# Patient Record
Sex: Female | Born: 1935 | Race: White | Hispanic: No | Smoking: Never smoker
Health system: Southern US, Community
[De-identification: ages and names within clinical notes are randomized; demographics above are authoritative.]

## PROBLEM LIST (undated history)

## (undated) DIAGNOSIS — M25461 Effusion, right knee: Secondary | ICD-10-CM

## (undated) DIAGNOSIS — I639 Cerebral infarction, unspecified: Secondary | ICD-10-CM

## (undated) DIAGNOSIS — R1310 Dysphagia, unspecified: Secondary | ICD-10-CM

## (undated) DIAGNOSIS — E119 Type 2 diabetes mellitus without complications: Secondary | ICD-10-CM

## (undated) DIAGNOSIS — H3530 Unspecified macular degeneration: Secondary | ICD-10-CM

## (undated) DIAGNOSIS — I10 Essential (primary) hypertension: Secondary | ICD-10-CM

## (undated) DIAGNOSIS — M064 Inflammatory polyarthropathy: Secondary | ICD-10-CM

## (undated) DIAGNOSIS — E538 Deficiency of other specified B group vitamins: Secondary | ICD-10-CM

## (undated) DIAGNOSIS — D72829 Elevated white blood cell count, unspecified: Secondary | ICD-10-CM

## (undated) DIAGNOSIS — L89152 Pressure ulcer of sacral region, stage 2: Secondary | ICD-10-CM

## (undated) DIAGNOSIS — I6930 Unspecified sequelae of cerebral infarction: Secondary | ICD-10-CM

## (undated) DIAGNOSIS — N390 Urinary tract infection, site not specified: Secondary | ICD-10-CM

## (undated) DIAGNOSIS — G9340 Encephalopathy, unspecified: Secondary | ICD-10-CM

## (undated) DIAGNOSIS — E785 Hyperlipidemia, unspecified: Secondary | ICD-10-CM

## (undated) DIAGNOSIS — R5381 Other malaise: Secondary | ICD-10-CM

## (undated) DIAGNOSIS — R32 Unspecified urinary incontinence: Secondary | ICD-10-CM

## (undated) DIAGNOSIS — C50919 Malignant neoplasm of unspecified site of unspecified female breast: Secondary | ICD-10-CM

## (undated) DIAGNOSIS — M1711 Unilateral primary osteoarthritis, right knee: Secondary | ICD-10-CM

## (undated) DIAGNOSIS — A419 Sepsis, unspecified organism: Secondary | ICD-10-CM

## (undated) DIAGNOSIS — F0150 Vascular dementia without behavioral disturbance: Secondary | ICD-10-CM

## (undated) HISTORY — PX: BREAST LUMPECTOMY: SHX2

## (undated) HISTORY — DX: Other malaise: R53.81

## (undated) HISTORY — DX: Encephalopathy, unspecified: G93.40

## (undated) HISTORY — DX: Cerebral infarction, unspecified: I63.9

## (undated) HISTORY — DX: Pressure ulcer of sacral region, stage 2: L89.152

## (undated) HISTORY — PX: ABDOMINAL HYSTERECTOMY: SHX81

## (undated) HISTORY — DX: Hyperlipidemia, unspecified: E78.5

## (undated) HISTORY — DX: Effusion, right knee: M25.461

## (undated) HISTORY — DX: Sepsis, unspecified organism: A41.9

## (undated) HISTORY — DX: Malignant neoplasm of unspecified site of unspecified female breast: C50.919

## (undated) HISTORY — DX: Unspecified urinary incontinence: R32

## (undated) HISTORY — DX: Vascular dementia, unspecified severity, without behavioral disturbance, psychotic disturbance, mood disturbance, and anxiety: F01.50

## (undated) HISTORY — DX: Hypomagnesemia: E83.42

## (undated) HISTORY — DX: Essential (primary) hypertension: I10

## (undated) HISTORY — DX: Deficiency of other specified B group vitamins: E53.8

## (undated) HISTORY — DX: Elevated white blood cell count, unspecified: D72.829

## (undated) HISTORY — DX: Unspecified sequelae of cerebral infarction: I69.30

## (undated) HISTORY — DX: Unspecified macular degeneration: H35.30

## (undated) HISTORY — DX: Type 2 diabetes mellitus without complications: E11.9

## (undated) HISTORY — DX: Dysphagia, unspecified: R13.10

## (undated) HISTORY — PX: CATARACT EXTRACTION: SUR2

## (undated) HISTORY — DX: Inflammatory polyarthropathy: M06.4

## (undated) HISTORY — DX: Unilateral primary osteoarthritis, right knee: M17.11

## (undated) HISTORY — DX: Urinary tract infection, site not specified: N39.0

---

## 1998-02-05 DIAGNOSIS — E119 Type 2 diabetes mellitus without complications: Secondary | ICD-10-CM | POA: Insufficient documentation

## 1998-02-05 HISTORY — DX: Type 2 diabetes mellitus without complications: E11.9

## 2009-02-05 DIAGNOSIS — I639 Cerebral infarction, unspecified: Secondary | ICD-10-CM

## 2009-02-05 HISTORY — DX: Cerebral infarction, unspecified: I63.9

## 2016-08-12 LAB — HEPATIC FUNCTION PANEL
ALT: 9 (ref 7–35)
AST: 15 (ref 13–35)
Alkaline Phosphatase: 64 (ref 25–125)
Bilirubin, Total: 0.3

## 2016-08-18 DIAGNOSIS — M25461 Effusion, right knee: Secondary | ICD-10-CM

## 2016-08-18 HISTORY — DX: Effusion, right knee: M25.461

## 2016-08-18 LAB — BASIC METABOLIC PANEL
BUN: 17 (ref 4–21)
CREATININE: 0.7 (ref 0.5–1.1)
POTASSIUM: 3.5 (ref 3.4–5.3)
Sodium: 141 (ref 137–147)

## 2016-08-18 LAB — CBC AND DIFFERENTIAL
HEMATOCRIT: 30 — AB (ref 36–46)
Hemoglobin: 10.2 — AB (ref 12.0–16.0)
Platelets: 429 — AB (ref 150–399)
WBC: 11.5

## 2016-08-20 ENCOUNTER — Encounter: Payer: Self-pay | Admitting: Internal Medicine

## 2016-08-20 ENCOUNTER — Non-Acute Institutional Stay (SKILLED_NURSING_FACILITY): Payer: Medicare Other | Admitting: Internal Medicine

## 2016-08-20 DIAGNOSIS — E785 Hyperlipidemia, unspecified: Secondary | ICD-10-CM | POA: Insufficient documentation

## 2016-08-20 DIAGNOSIS — I6930 Unspecified sequelae of cerebral infarction: Secondary | ICD-10-CM | POA: Insufficient documentation

## 2016-08-20 DIAGNOSIS — E538 Deficiency of other specified B group vitamins: Secondary | ICD-10-CM | POA: Diagnosis not present

## 2016-08-20 DIAGNOSIS — M25461 Effusion, right knee: Secondary | ICD-10-CM | POA: Diagnosis not present

## 2016-08-20 DIAGNOSIS — R197 Diarrhea, unspecified: Secondary | ICD-10-CM

## 2016-08-20 DIAGNOSIS — B373 Candidiasis of vulva and vagina: Secondary | ICD-10-CM | POA: Diagnosis not present

## 2016-08-20 DIAGNOSIS — E119 Type 2 diabetes mellitus without complications: Secondary | ICD-10-CM

## 2016-08-20 DIAGNOSIS — I10 Essential (primary) hypertension: Secondary | ICD-10-CM | POA: Diagnosis not present

## 2016-08-20 DIAGNOSIS — M1990 Unspecified osteoarthritis, unspecified site: Secondary | ICD-10-CM

## 2016-08-20 DIAGNOSIS — G9340 Encephalopathy, unspecified: Secondary | ICD-10-CM

## 2016-08-20 DIAGNOSIS — B3731 Acute candidiasis of vulva and vagina: Secondary | ICD-10-CM | POA: Insufficient documentation

## 2016-08-20 DIAGNOSIS — F0150 Vascular dementia without behavioral disturbance: Secondary | ICD-10-CM | POA: Diagnosis not present

## 2016-08-20 HISTORY — DX: Unspecified sequelae of cerebral infarction: I69.30

## 2016-08-20 HISTORY — DX: Encephalopathy, unspecified: G93.40

## 2016-08-20 HISTORY — DX: Deficiency of other specified B group vitamins: E53.8

## 2016-08-20 LAB — BASIC METABOLIC PANEL
BUN: 21 (ref 4–21)
CREATININE: 0.7 (ref 0.5–1.1)
Glucose: 196
Potassium: 4.4 (ref 3.4–5.3)
Sodium: 138 (ref 137–147)

## 2016-08-20 LAB — CBC AND DIFFERENTIAL
HEMATOCRIT: 34 — AB (ref 36–46)
HEMOGLOBIN: 11.2 — AB (ref 12.0–16.0)
PLATELETS: 495 — AB (ref 150–399)
WBC: 13.4

## 2016-08-20 NOTE — Progress Notes (Signed)
**Note De-[place] via Obfuscation** : Provider:  Noah Delaine. Sheppard Coil, MD Location:  [place] Room Number: [place] of Service:  SNF ([phone])  PCP: Hennie Duos, MD Patient Care Team: Hennie Duos, MD as PCP - General (Internal Medicine)  Extended Emergency Contact Information Primary Emergency Contact: Alumbaugh,[name] Address: [address]          [place], [place] [address] [place] Phone: [phone] Relation: Son     Allergies: Patient has Lauren known allergies.  Chief Complaint  Patient presents with  . New Admit To SNF    following hospitalization [place] 08/12/16 to 08/18/16 altered mental state    HPI: Patient is 83 y.o. female with history of osteoarthritis, hypertension, dementia, history of CVA with left hemiparesis, who went to [facility] emergency department after experiencing 2 weeks of weakness, and decreased activity from her baseline. Sugar use with transfer to toilet and inability to feed herself which was new. She was seen by her PCP for possible UTI was treated for 8 days with Lauren improvement patient had had a leukocytosis at her PCPs office and was noted to have continued right blood cell elevation after treatment. Patient was admitted to [facility] from 7/8-14 to workup her leukocytosis. Patient was found to have a right knee effusion. She underwent arthrocentesis by Dr. Elissa Hefty. Synovial fluid showed 4+ PMNs but Lauren organisms seen on Gram stain and culture was without growth; fluid glucose was 133, there were Lauren crystals present. She was secondary for right knee washout on 08/14/2016 after which leukocytosis and swelling resolved. Patient is now being treated for culture-negative septic arthritis with IV vancomycin and by mouth Cipro until 09/11/2016. Hospital course was complicated by confusion and delirium felt to be secondary to her underlying dementia, sickness, and opiates. This resolved. Hospital course for further, located by  acute diarrhea, now resolved, C. difficile was negative. Patient is admitted to skilled nursing facility for OT/PT. While at skilled nursing facility patient will be followed for hypertension treated with Norvasc and metoprolol, history of CVA with left-sided weakness treated with Plavix, aspirin and statin, and DM 2 treated with glipizide and metformin.  Past Medical History:  Diagnosis Date  . Breast cancer ([place])   . Diabetes mellitus type 2, controlled, without complications ([place]) 7062  . Effusion of right knee 08/18/2016  . Essential hypertension   . Hyperlipidemia   . Hypomagnesemia   . Leucocytosis   . Macular degeneration   . Osteoarthritis of right knee   . Physical debility   . Stroke [facility]) 2011   left sided weakness baseline  . Urinary incontinence in female   . Vascular dementia   . Vitamin B12 deficiency     Past Surgical History:  Procedure Laterality Date  . ABDOMINAL HYSTERECTOMY    . BREAST LUMPECTOMY    . CATARACT EXTRACTION      Allergies as of 08/20/2016   Lauren Known Allergies     Medication List       Accurate as of 08/20/16 10:22 AM. Always use your most recent med list.          acetaminophen 500 MG tablet Commonly known as:  TYLENOL Take 500 mg by mouth. Take one tablet every 6 hours as needed for pain   Acidophilus Lactobacillus Caps Take by mouth. Take one capsule by mouth two times a day with meals   amLODipine 10 MG tablet Commonly known as:  NORVASC Take 10  mg by mouth. Take one tablet daily for blood pressure   aspirin 325 MG tablet Take 325 mg by mouth. Take one tablet nightly   CALCIUM 600-D PO Take by mouth. Take one daily   Cholecalciferol 1000 units tablet Take 1,000 Units by mouth. Take one Vitamin D capsule daily   ciprofloxacin 500 MG tablet Commonly known as:  CIPRO Take 500 mg by mouth. Take one tablet every 12 hours for 24 days   clopidogrel 75 MG tablet Commonly known as:  PLAVIX Take 75 mg by mouth. Take one  tablet daily   glipiZIDE 2.5 MG 24 hr tablet Commonly known as:  GLUCOTROL XL Take 2.5 mg by mouth. Take one tablet daily   heparin NICU PF 100 UNIT/ML Soln injection Inject into the vein. Infuse 3 ml (300 units total) into a venous catheter two times a day. (PICC line flush)   Lidocaine 4 % Ptch Apply topically. Place one patch on the skin daily to right knee   losartan 50 MG tablet Commonly known as:  COZAAR Take 50 mg by mouth. Take one tablet daily   magnesium oxide 400 MG tablet Commonly known as:  MAG-OX Take 400 mg by mouth daily. Take one tablet two times a day for 14 days   metFORMIN 1000 MG tablet Commonly known as:  GLUCOPHAGE Take 1,000 mg by mouth 2 (two) times daily with a meal.   metoprolol tartrate 50 MG tablet Commonly known as:  LOPRESSOR Take 50 mg by mouth. Take one tablet twice daily for blood pressure   multivitamin tablet Take 1 tablet by mouth daily.   nystatin cream Commonly known as:  MYCOSTATIN Apply 1 application topically. Apply one application topically two times a day as needed for dry ski   ondansetron 4 MG tablet Commonly known as:  ZOFRAN Take 4 mg by mouth. Take one tablet every six hours as needed for nausea   oxybutynin 3.9 MG/24HR Commonly known as:  OXYTROL Place 1 patch onto the skin. Place one patch on the skin daily   polyvinyl alcohol 1.4 % ophthalmic solution Commonly known as:  LIQUIFILM TEARS 1 drop. One drop to both eyes four times a day   pravastatin 40 MG tablet Commonly known as:  PRAVACHOL Take 40 mg by mouth. Take one tablet nightly   Vancomycin HCl in NaCl 500-0.9 MG/100ML-% Soln Inject into the vein. Infuse 500 mg into a venous catheter ever twelve hours for 24 days   vitamin B-12 1000 MCG tablet Commonly known as:  CYANOCOBALAMIN Take 1,000 mcg by mouth. Take one Vitamin B-12 tablet daily       Meds ordered this encounter  Medications  . ciprofloxacin (CIPRO) 500 MG tablet    Sig: Take 500 mg by  mouth. Take one tablet every 12 hours for 24 days  . vitamin B-12 (CYANOCOBALAMIN) 1000 MCG tablet    Sig: Take 1,000 mcg by mouth. Take one Vitamin B-12 tablet daily  . Heparin Lock Flush (HEPARIN NICU PF) 100 UNIT/ML SOLN injection    Sig: Inject into the vein. Infuse 3 ml (300 units total) into a venous catheter two times a day. (PICC line flush)  . Acidophilus Lactobacillus CAPS    Sig: Take by mouth. Take one capsule by mouth two times a day with meals  . Lidocaine 4 % PTCH    Sig: Apply topically. Place one patch on the skin daily to right knee  . magnesium oxide (MAG-OX) 400 MG tablet    Sig:  Take 400 mg by mouth daily. Take one tablet two times a day for 14 days  . Vancomycin HCl in NaCl 500-0.9 MG/100ML-% SOLN    Sig: Inject into the vein. Infuse 500 mg into a venous catheter ever twelve hours for 24 days  . losartan (COZAAR) 50 MG tablet    Sig: Take 50 mg by mouth. Take one tablet daily  . acetaminophen (TYLENOL) 500 MG tablet    Sig: Take 500 mg by mouth. Take one tablet every 6 hours as needed for pain  . amLODipine (NORVASC) 10 MG tablet    Sig: Take 10 mg by mouth. Take one tablet daily for blood pressure  . aspirin 325 MG tablet    Sig: Take 325 mg by mouth. Take one tablet nightly  . Calcium Carb-Cholecalciferol (CALCIUM 600-D PO)    Sig: Take by mouth. Take one daily  . Cholecalciferol 1000 units tablet    Sig: Take 1,000 Units by mouth. Take one Vitamin D capsule daily  . clopidogrel (PLAVIX) 75 MG tablet    Sig: Take 75 mg by mouth. Take one tablet daily  . glipiZIDE (GLUCOTROL XL) 2.5 MG 24 hr tablet    Sig: Take 2.5 mg by mouth. Take one tablet daily  . metFORMIN (GLUCOPHAGE) 1000 MG tablet    Sig: Take 1,000 mg by mouth 2 (two) times daily with a meal.  . metoprolol tartrate (LOPRESSOR) 50 MG tablet    Sig: Take 50 mg by mouth. Take one tablet twice daily for blood pressure  . Multiple Vitamin (MULTIVITAMIN) tablet    Sig: Take 1 tablet by mouth daily.  Marland Kitchen  nystatin cream (MYCOSTATIN)    Sig: Apply 1 application topically. Apply one application topically two times a day as needed for dry ski  . ondansetron (ZOFRAN) 4 MG tablet    Sig: Take 4 mg by mouth. Take one tablet every six hours as needed for nausea  . oxybutynin (OXYTROL) 3.9 MG/24HR    Sig: Place 1 patch onto the skin. Place one patch on the skin daily  . polyvinyl alcohol (LIQUIFILM TEARS) 1.4 % ophthalmic solution    Sig: 1 drop. One drop to both eyes four times a day  . pravastatin (PRAVACHOL) 40 MG tablet    Sig: Take 40 mg by mouth. Take one tablet nightly    Immunization History  Administered Date(s) Administered  . Influenza-Unspecified 11/06/2015  . PPD Test 08/18/2016  . Pneumococcal-Unspecified 09/06/2015    Social History  Substance Use Topics  . Smoking status: Never Smoker  . Smokeless tobacco: Never Used  . Alcohol use Lauren    Family history is   Family History  Problem Relation Age of Onset  . Hypertension Mother   . Macular degeneration Father   . Stroke Father   . Macular degeneration Sister       Review of Systems  DATA OBTAINED: from patient, family member GENERAL:  Lauren fevers, +fatigue, appetite changes SKIN: Lauren itching, or rash EYES: Lauren eye pain, redness, discharge EARS: Lauren earache, tinnitus, change in hearing NOSE: Lauren congestion, drainage or bleeding  MOUTH/THROAT: Lauren mouth or tooth pain, Lauren sore throat RESPIRATORY: Lauren cough, wheezing, SOB CARDIAC: Lauren chest pain, palpitations, lower extremity edema  GI: Lauren abdominal pain, Lauren N/V/D or constipation, Lauren heartburn or reflux  GU: Lauren dysuria, frequency or urgency, or incontinence  MUSCULOSKELETAL:Pain He is a little better NEUROLOGIC: Lauren headache, dizziness or focal weakness PSYCHIATRIC: Lauren c/o anxiety or sadness   Vitals:  08/20/16 0916  BP: (!) 153/65  Pulse: 71  Resp: 15  Temp: (!) 97.5 F (36.4 C)    SpO2 Readings from Last 1 Encounters:  08/20/16 96%   Body mass index is 21.22  kg/m.     Physical Exam  GENERAL APPEARANCE: Alert, Moderately conversant,  Lauren acute distress.  SKIN: Right knee with moderate swelling, mild heat, Lauren redness, suture area is healing well. HEAD: Normocephalic, atraumatic  EYES: Conjunctiva/lids clear. Pupils round, reactive. EOMs intact.  EARS: External exam WNL, canals clear. Hearing grossly normal.  NOSE: Lauren deformity or discharge.  MOUTH/THROAT: Lips w/o lesions  RESPIRATORY: Breathing is even, unlabored. Lung sounds are clear   CARDIOVASCULAR: Heart RRR Lauren murmurs, rubs or gallops. Lauren peripheral edema.   GASTROINTESTINAL: Abdomen is soft, non-tender, not distended w/ normal bowel sounds. GENITOURINARY: Bladder non tender, not distended  MUSCULOSKELETAL: Lauren abnormal joints or musculature except CT scan NEUROLOGIC:  Cranial nerves 2-12 grossly intact. Moves all extremities  PSYCHIATRIC: Mood and affect appropriate to situation with some dementia, Lauren behavioral issues  There are Lauren active problems to display for this patient.     Labs reviewed: Basic Metabolic Panel:    Component Value Date/Time   NA 141 08/18/2016   K 3.5 08/18/2016   BUN 17 08/18/2016   CREATININE 0.7 08/18/2016   AST 15 08/12/2016   ALT 9 08/12/2016   ALKPHOS 64 08/12/2016     Recent Labs  08/18/16  NA 141  K 3.5  BUN 17  CREATININE 0.7   Liver Function Tests:  Recent Labs  08/12/16  AST 15  ALT 9  ALKPHOS 64   Lauren results for input(s): LIPASE, AMYLASE in the last 8760 hours. Lauren results for input(s): AMMONIA in the last 8760 hours. CBC:  Recent Labs  08/18/16  WBC 11.5  HGB 10.2*  HCT 30*  PLT 429*   Lipid Lauren results for input(s): CHOL, HDL, LDLCALC, TRIG in the last 8760 hours.  Cardiac Enzymes: Lauren results for input(s): CKTOTAL, CKMB, CKMBINDEX, TROPONINI in the last 8760 hours. BNP: No results for input(s): BNP in the last 8760 hours. Lauren results found for: MICROALBUR Lauren results found for: HGBA1C Lauren results found for:  TSH Lauren results found for: VITAMINB12 Lauren results found for: FOLATE Lauren results found for: IRON, TIBC, FERRITIN  Imaging and Procedures obtained prior to SNF admission: Patient was never admitted.   Not all labs, radiology exams or other studies done during hospitalization come through on my EPIC note; however they are reviewed by me.    Assessment and Plan  LEUKOCYTOSIS/ASEPTIC ARTHRITIS-found to be the source of patient's leukocytosis and resolved after patient underwent arthrocentesis then right knee washout on 08/14/2016. Patient is being treated for culture-negative septic arthritis with IV vancomycin and by mouth Cipro until 09/11/2016 SNF-admitted for OT/PT and for IV antibiotics until 09/11/2016. Vancomycin will be followed with peaks and troughs per pharmacy protocol and patient will have weekly labs including CBC with differential, CMP, sedimentation rate C, C-reactive protein, and vancomycin trough levels which should be faxed to Dr. Vanessa Chickasaw at [phone]  ACUTE ENCEPHALOPATHY DEMENTIA/B 12 DEFICIENCY-felt to be secondary to underlying vascular dementia opiates and hospitalization; patient now baseline; TSH was normal; MRI without acute pathology; patient was found to have a vitamin B-12 deficiency that was repleted with IM B12 and patient is now on oral supplement SNF -continue B12 supplement 1000 g daily  ACUTE DIARRHEA-resolved on its own, C. difficile was negative, Lactobacillus will be continued as  long as patient is on antibiotics SNF -continue 4*250 mg twice a day until a week after 09/11/2016.  History of CVA WITH LEFT WEAKNESS SNF -stable continue ASA 81 mg daily, Plavix 75 mg daily, and statin.  HTN SNF -controlled; continue Norvasc 10 mg daily and Lopressor 75 mg twice a day  DM 2 SNF - we'll stated the blood sugars were controlled in hospital; plan to continue glipizide 2.5 mg 24-hour tablet 1 by mouth daily and Glucophage 1000 mg by mouth twice a day we'll  check blood sugars every morning  [facility] SNF - wound care nurse reports the patient has extensive used and the gluteal fold and GU area; have written for nystatin cream 100,000 units per gram to be applied daily and for Diflucan 200 mg by mouth daily 1 and 100 mg by mouth daily for 10 days.   Time spent greater than 45 minutes;> 50% of time with patient was spent reviewing records, labs, tests and studies, counseling and developing plan of care  Noah Delaine. Sheppard Coil, MD

## 2016-08-29 ENCOUNTER — Other Ambulatory Visit: Payer: Self-pay

## 2016-09-06 DIAGNOSIS — R1310 Dysphagia, unspecified: Secondary | ICD-10-CM

## 2016-09-06 HISTORY — DX: Dysphagia, unspecified: R13.10

## 2016-09-18 ENCOUNTER — Encounter: Payer: Self-pay | Admitting: Internal Medicine

## 2016-09-18 ENCOUNTER — Non-Acute Institutional Stay (SKILLED_NURSING_FACILITY): Payer: Medicare Other | Admitting: Internal Medicine

## 2016-09-18 DIAGNOSIS — R1110 Vomiting, unspecified: Secondary | ICD-10-CM

## 2016-09-18 DIAGNOSIS — K6389 Other specified diseases of intestine: Secondary | ICD-10-CM | POA: Diagnosis not present

## 2016-09-18 DIAGNOSIS — I10 Essential (primary) hypertension: Secondary | ICD-10-CM | POA: Diagnosis not present

## 2016-09-18 DIAGNOSIS — R197 Diarrhea, unspecified: Secondary | ICD-10-CM | POA: Diagnosis not present

## 2016-09-18 DIAGNOSIS — M064 Inflammatory polyarthropathy: Secondary | ICD-10-CM | POA: Diagnosis not present

## 2016-09-18 DIAGNOSIS — E119 Type 2 diabetes mellitus without complications: Secondary | ICD-10-CM | POA: Diagnosis not present

## 2016-09-18 DIAGNOSIS — M00061 Staphylococcal arthritis, right knee: Secondary | ICD-10-CM

## 2016-09-18 DIAGNOSIS — I6930 Unspecified sequelae of cerebral infarction: Secondary | ICD-10-CM | POA: Diagnosis not present

## 2016-09-18 DIAGNOSIS — K222 Esophageal obstruction: Secondary | ICD-10-CM | POA: Diagnosis not present

## 2016-09-18 DIAGNOSIS — R1310 Dysphagia, unspecified: Secondary | ICD-10-CM | POA: Diagnosis not present

## 2016-09-18 DIAGNOSIS — E785 Hyperlipidemia, unspecified: Secondary | ICD-10-CM

## 2016-09-18 DIAGNOSIS — R1319 Other dysphagia: Secondary | ICD-10-CM

## 2016-09-18 DIAGNOSIS — N3000 Acute cystitis without hematuria: Secondary | ICD-10-CM

## 2016-09-18 NOTE — Progress Notes (Signed)
: Provider:   Location:  [facility] Room Number: 107 Place of Service:  SNF (31)  PCP: Hennie Duos, MD Patient Care Team: Hennie Duos, MD as PCP - General (Internal Medicine)  Extended Emergency Contact Information Primary Emergency Contact: Haueter,[name] Address: [address]          [place], [place] [address] [place] Phone: [phone] Relation: Son     Allergies: Patient has no known allergies.  Chief Complaint  Patient presents with  . Readmit To SNF    following hospitalzation 09/06/16 to 09/17/16 dysphagia    HPI: Patient is 83 y.o. female with severe osteoarthritis, hypertension, dementia, stroke with left hemiparesis, and recently diagnosed with right knee septic arthritis who was on for weeks of by mouth Cipro and IV vancomycin to August 7. She presented to [facility] EGD from skilled nursing facility for sore throat for 2 days fever of 100.5 and recurrent vomiting. Patient was admitted to [facility] from 8/2-13 for workup of her recurrent vomiting. She was found to have esophageal stricture with food debris in the esophagus; however patient cannot be dilated because she was on aspirin and Plavix and Lovenox. Patient still needs dilatation as an outpatient. Patient was changed to soft diet until she can have an endoscopy. She was on Reglan and this pushed her into diarrhea and Reglan was DC'd/made when necessary. Hospital course was complicated by a generalized inflammatory part polyarthritis which is treated by IV steroids then by mouth steroids . We will need to be continued in the skilled nursing facility. Patient did finish her IV antibiotic course for her right septic knee and for an acute UTI. Patient is admitted back to skilled nursing facility for continued OT/PT while at skilled nursing facility patient will be followed for hypertension treated with Norvasc and losartan and  metoprolol, hyperlipidemia treated with Pravachol and diabetes type 2 treated with glipizide and metformin.  Past Medical History:  Diagnosis Date  . Breast cancer ([place])   . Diabetes mellitus type 2, controlled, without complications ([place]) 5852  . Dysphagia 09/06/2016  . Effusion of right knee 08/18/2016  . Essential hypertension   . Hyperlipidemia   . Hypomagnesemia   . Leucocytosis   . Macular degeneration   . Osteoarthritis of right knee   . Physical debility   . Stroke [facility]) 2011   left sided weakness baseline  . Urinary incontinence in female   . Vascular dementia   . Vitamin B12 deficiency     Past Surgical History:  Procedure Laterality Date  . ABDOMINAL HYSTERECTOMY    . BREAST LUMPECTOMY    . CATARACT EXTRACTION      Allergies as of 09/18/2016   No Known Allergies     Medication List       Accurate as of 09/18/16  9:55 AM. Always use your most recent med list.          acetaminophen 500 MG tablet Commonly known as:  TYLENOL Take 500 mg by mouth. Take one tablet every 6 hours as needed for pain   Acidophilus Lactobacillus Caps Take by mouth. Take one capsule by mouth two times a day with meals   amLODipine 10 MG tablet Commonly known as:  NORVASC Take 10 mg by mouth. Take one tablet daily for blood pressure   aspirin 325 MG tablet Take 325 mg by mouth. Take one tablet nightly   CALCIUM 600-D  PO Take by mouth. Take one daily   Cholecalciferol 1000 units tablet Take 1,000 Units by mouth. Take one Vitamin D capsule daily   clopidogrel 75 MG tablet Commonly known as:  PLAVIX Take 75 mg by mouth. Take one tablet daily   glipiZIDE 2.5 MG 24 hr tablet Commonly known as:  GLUCOTROL XL Take 2.5 mg by mouth. Take one tablet daily   HUMALOG 100 UNIT/ML injection Generic drug:  insulin lispro Inject into the skin. Moderate SSI, hold after patient off of steroid   HYDROcodone-acetaminophen 5-325 MG tablet Commonly known as:  NORCO/VICODIN Take 1  tablet by mouth. Take one tablet every 6 hours as needed for pain up to 5 days   Lidocaine 4 % Ptch Apply topically. Place one patch on the skin daily to right knee   losartan 50 MG tablet Commonly known as:  COZAAR Take 50 mg by mouth. Take one tablet daily   metFORMIN 1000 MG tablet Commonly known as:  GLUCOPHAGE Take 1,000 mg by mouth 2 (two) times daily with a meal.   metoCLOPramide 5 MG tablet Commonly known as:  REGLAN Take 5 mg by mouth. Take one tablet 3 times daily as needed up to 14 days   metoprolol tartrate 50 MG tablet Commonly known as:  LOPRESSOR Take 50 mg by mouth. Take one tablet twice daily for blood pressure   multivitamin tablet Take 1 tablet by mouth daily.   nystatin cream Commonly known as:  MYCOSTATIN Apply 1 application topically. Apply one application topically two times a day as needed for dry ski   omeprazole 20 MG capsule Commonly known as:  PRILOSEC Take 20 mg by mouth. Take one tablet twice daily   ondansetron 4 MG tablet Commonly known as:  ZOFRAN Take 4 mg by mouth. Take one tablet every six hours as needed for nausea   oxybutynin 3.9 MG/24HR Commonly known as:  OXYTROL Place 1 patch onto the skin. Place one patch on the skin daily   polyvinyl alcohol 1.4 % ophthalmic solution Commonly known as:  LIQUIFILM TEARS 1 drop. One drop to both eyes four times a day   pravastatin 40 MG tablet Commonly known as:  PRAVACHOL Take 40 mg by mouth. Take one tablet nightly   predniSONE 10 MG tablet Commonly known as:  DELTASONE Take 10 mg by mouth. 2 tablets for next 3 days, then one tablet for 3 days, then stop. Use sliding scale insulin while on steroids   vitamin B-12 1000 MCG tablet Commonly known as:  CYANOCOBALAMIN Take 1,000 mcg by mouth. Take one Vitamin B-12 tablet daily       Meds ordered this encounter  Medications  . HYDROcodone-acetaminophen (NORCO/VICODIN) 5-325 MG tablet    Sig: Take 1 tablet by mouth. Take one tablet  every 6 hours as needed for pain up to 5 days  . metoCLOPramide (REGLAN) 5 MG tablet    Sig: Take 5 mg by mouth. Take one tablet 3 times daily as needed up to 14 days  . omeprazole (PRILOSEC) 20 MG capsule    Sig: Take 20 mg by mouth. Take one tablet twice daily  . insulin lispro (HUMALOG) 100 UNIT/ML injection    Sig: Inject into the skin. Moderate SSI, hold after patient off of steroid  . predniSONE (DELTASONE) 10 MG tablet    Sig: Take 10 mg by mouth. 2 tablets for next 3 days, then one tablet for 3 days, then stop. Use sliding scale insulin while on steroids  Immunization History  Administered Date(s) Administered  . Influenza-Unspecified 11/06/2015  . PPD Test 08/18/2016  . Pneumococcal-Unspecified 09/06/2015    Social History  Substance Use Topics  . Smoking status: Never Smoker  . Smokeless tobacco: Never Used  . Alcohol use No    Family history is   Family History  Problem Relation Age of Onset  . Hypertension Mother   . Macular degeneration Father   . Stroke Father   . Macular degeneration Sister       Review of Systems  DATA OBTAINED: From nurse GENERAL:  no fevers, fatigue, appetite changes SKIN: No itching, or rash EYES: No eye pain, redness, discharge EARS: No earache, tinnitus, change in hearing NOSE: No congestion, drainage or bleeding  MOUTH/THROAT: No mouth or tooth pain, No sore throat RESPIRATORY: No cough, wheezing, SOB CARDIAC: No chest pain, palpitations, lower extremity edema  GI: No abdominal pain, No N/V/D or constipation, No heartburn or reflux  GU: No dysuria, frequency or urgency, or incontinence  MUSCULOSKELETAL: No unrelieved bone/joint pain NEUROLOGIC: No headache, dizziness or focal weakness PSYCHIATRIC: No c/o anxiety or sadness   Vitals:   09/18/16 0918  BP: (!) 150/70  Pulse: 78  Resp: 18  Temp: (!) 97 F (36.1 C)  SpO2: 95%    SpO2 Readings from Last 1 Encounters:  09/18/16 95%   Body mass index is 21.91  kg/m.     Physical Exam  GENERAL APPEARANCE: Alert, conversant,  No acute distress.  SKIN: No diaphoresis rash HEAD: Normocephalic, atraumatic  EYES: Conjunctiva/lids clear. Pupils round, reactive. EOMs intact.  EARS: External exam WNL, canals clear. Hearing grossly normal.  NOSE: No deformity or discharge.  MOUTH/THROAT: Lips w/o lesions  RESPIRATORY: Breathing is even, unlabored. Lung sounds are clear   CARDIOVASCULAR: Heart RRR no murmurs, rubs or gallops. No peripheral edema.   GASTROINTESTINAL: Abdomen is soft, non-tender, Mild distended w/ normal bowel sounds. GENITOURINARY: Bladder non tender, not distended  MUSCULOSKELETAL: No abnormal joints or musculature NEUROLOGIC:  Cranial nerves 2-12 grossly intact. Moves all extremities  PSYCHIATRIC: Mood and affect appropriate to situation with dementia, no behavioral issues  Patient Active Problem List   Diagnosis Date Noted  . Arthritis 08/20/2016  . Acute encephalopathy 08/20/2016  . B12 deficiency 08/20/2016  . Acute diarrhea 08/20/2016  . History of stroke with residual deficit 08/20/2016  . Hyperlipidemia 08/20/2016  . Vascular dementia 08/20/2016  . Essential hypertension 08/20/2016  . Yeast infection involving the vagina and surrounding area 08/20/2016  . Effusion of right knee 08/18/2016  . Diabetes mellitus type 2, controlled, without complications ([place]) 37/16/9678      Labs reviewed: Basic Metabolic Panel:    Component Value Date/Time   NA 138 08/20/2016   K 4.4 08/20/2016   BUN 21 08/20/2016   CREATININE 0.7 08/20/2016   AST 15 08/12/2016   ALT 9 08/12/2016   ALKPHOS 64 08/12/2016     Recent Labs  08/18/16 08/20/16  NA 141 138  K 3.5 4.4  BUN 17 21  CREATININE 0.7 0.7   Liver Function Tests:  Recent Labs  08/12/16  AST 15  ALT 9  ALKPHOS 64   No results for input(s): LIPASE, AMYLASE in the last 8760 hours. No results for input(s): AMMONIA in the last 8760 hours. CBC:  Recent Labs   08/18/16 08/20/16  WBC 11.5 13.4  HGB 10.2* 11.2*  HCT 30* 34*  PLT 429* 495*   Lipid No results for input(s): CHOL, HDL, LDLCALC, TRIG in the  last 8760 hours.  Cardiac Enzymes: No results for input(s): CKTOTAL, CKMB, CKMBINDEX, TROPONINI in the last 8760 hours. BNP: No results for input(s): BNP in the last 8760 hours. No results found for: MICROALBUR No results found for: HGBA1C No results found for: TSH No results found for: VITAMINB12 No results found for: FOLATE No results found for: IRON, TIBC, FERRITIN  Imaging and Procedures obtained prior to SNF admission: Patient was never admitted.   Not all labs, radiology exams or other studies done during hospitalization come through on my EPIC note; however they are reviewed by me.    Assessment and Plan  ACUTE DYSPHASIA/ESOPHAGEAL stricture-seen by GI and underwent EGD. Was found to have food debris in the esophagus and have a lower esophageal stricture. Esophagus could not be dilated secondary to the fact the patient was on aspirin, Plavix and Lovenox. Patient will need a follow-up outpatient with Dr. Esperanza Richters for a follow-up EGD and dilatation of stricture after holding Plavix for 7 days. Diet has been changed to soft until EGD can be done. SNF -admitted for OT/PT; will continue soft diet and monitor swallowing.  CHRONIC INTERMITTENT NAUSEA AND VOMITING-felt to be multifactorial relating to esophageal stricture and gastroparesis. X-ray showed persistent gaseous distention of the stomach patient had been tolerating regular diet and was placed on Reglan, however and ordered to avoid problems patient was replaced on soft diet until next endoscopy; patient developed diarrhea w inflammatory polyarthritis hile on scheduled Reglan so Reglan was placed on a when necessary basis SNF - will continue soft diet and Reglan 5 mg 3 times a day when necessary  GENERALIZED INFLAMMATORY POLYARTHRITIS-first few days of admission patient complained of  pain in multiple joints including right knee left knee right elbow. ESR was elevated to 106. No history of rheumatoid arthritis. She was started on IV steroids after which joint pain significantly improved. Patient with evidence placed on a steroid taper which will be continued on discharge to skilled nursing facility SNF - patient to be on prednisone taper for 6 more days and will be on sliding scale insulin until the steroid taper is completed.  RIGHT KNEE SEPTIC ARTHRITIS/UTI-finish course of antibiotics which treated a UTI as well as finishing treatment for patient's septic arthritis.  BACTERIAL OVERGROWTH SYNDROME-patient underwent a core course of Flagyl and rifaximin per ID recommendation with seeming resolution SNF -will monitor patient's for vomiting and diarrhea  DIABETES MELLITUS TYPE II-patient was placed on sliding scale insulin while in hospital while she was on steroids and will continue on sliding scale insulin until she is off her prednisone taper SNF -patient is restarting glipizide 2.5 mg daily and metformin 1000 mg by mouth twice a day with sliding scale insulin 4 times a day with meals and at bedtime for the duration of her steroid taper which is 6 more days  HISTORY OF CVA-with residual left-sided weakness SNF - plan to continue aspirin 325 mg by mouth daily Plavix 75 mg daily; patient is also on statin  HYPERTENSION- SNF - fair control on Norvasc 10 mg daily, losartan 50 mg daily and metoprolol 75 mg twice a day; will monitor every shift  Hyperlipidemia SNF -not stated as uncontrolled; plan to continue Pravachol 40 mg daily   Time spent greater than 35 minutes;> 50% of time with patient was spent reviewing records, labs, tests and studies, counseling and developing plan of care  Webb Silversmith D. Sheppard Coil, MD

## 2016-09-19 ENCOUNTER — Encounter: Payer: Self-pay | Admitting: Internal Medicine

## 2016-09-19 DIAGNOSIS — R1110 Vomiting, unspecified: Secondary | ICD-10-CM | POA: Insufficient documentation

## 2016-09-19 DIAGNOSIS — K6389 Other specified diseases of intestine: Secondary | ICD-10-CM | POA: Insufficient documentation

## 2016-09-19 DIAGNOSIS — K222 Esophageal obstruction: Secondary | ICD-10-CM | POA: Insufficient documentation

## 2016-09-19 DIAGNOSIS — N390 Urinary tract infection, site not specified: Secondary | ICD-10-CM

## 2016-09-19 DIAGNOSIS — T83511A Infection and inflammatory reaction due to indwelling urethral catheter, initial encounter: Secondary | ICD-10-CM

## 2016-09-19 DIAGNOSIS — R197 Diarrhea, unspecified: Secondary | ICD-10-CM

## 2016-09-19 DIAGNOSIS — M064 Inflammatory polyarthropathy: Secondary | ICD-10-CM | POA: Insufficient documentation

## 2016-09-19 DIAGNOSIS — M009 Pyogenic arthritis, unspecified: Secondary | ICD-10-CM | POA: Insufficient documentation

## 2016-09-19 HISTORY — DX: Urinary tract infection, site not specified: N39.0

## 2016-09-19 HISTORY — DX: Inflammatory polyarthropathy: M06.4

## 2016-10-11 LAB — BASIC METABOLIC PANEL
BUN: 18 (ref 4–21)
Creatinine: 0.6 (ref 0.5–1.1)
GLUCOSE: 121
Potassium: 4 (ref 3.4–5.3)
Sodium: 141 (ref 137–147)

## 2016-10-11 LAB — CBC AND DIFFERENTIAL
HEMATOCRIT: 32 — AB (ref 36–46)
HEMOGLOBIN: 10.2 — AB (ref 12.0–16.0)
Platelets: 701 — AB (ref 150–399)
WBC: 10.3

## 2016-10-16 ENCOUNTER — Non-Acute Institutional Stay (SKILLED_NURSING_FACILITY): Payer: Medicare Other | Admitting: Internal Medicine

## 2016-10-16 ENCOUNTER — Encounter: Payer: Self-pay | Admitting: Internal Medicine

## 2016-10-16 DIAGNOSIS — B961 Klebsiella pneumoniae [K. pneumoniae] as the cause of diseases classified elsewhere: Secondary | ICD-10-CM | POA: Diagnosis not present

## 2016-10-16 DIAGNOSIS — N3000 Acute cystitis without hematuria: Secondary | ICD-10-CM

## 2016-10-16 NOTE — Progress Notes (Signed)
Location:  [facility] Room Number: [place]:  SNF ([id number])  Lauren Duos, MD  Patient Care Team: Lauren Duos, MD as PCP - General (Internal Medicine)  Extended Emergency Contact Information Primary Emergency Contact: Furia,[name] Address: [address]          [place], [place] [address] [place] Phone: [phone] Relation: Son    Allergies: Patient has no known allergies.  Chief Complaint  Patient presents with  . Acute Visit    HPI: Patient is 83 y.o. female who Is being seen for a UTI. Several days ago she had some complaint of pain with urination and decreased mental status. No fever no vomiting, some decrease in appetite. Today her urine culture came back with greater than 100,000 of Klebsiella with antibiotic sensitivities.  Past Medical History:  Diagnosis Date  . Acute encephalopathy 08/20/2016  . B12 deficiency 08/20/2016  . Breast cancer ([place])   . Diabetes mellitus type 2, controlled, without complications ([place]) 5732  . Dysphagia 09/06/2016  . Effusion of right knee 08/18/2016  . Essential hypertension   . History of stroke with residual deficit 08/20/2016  . Hyperlipidemia   . Hypomagnesemia   . Leucocytosis   . Macular degeneration   . Osteoarthritis of right knee   . Physical debility   . Stroke [facility]) 2011   left sided weakness baseline  . Urinary incontinence in female   . Vascular dementia   . Vitamin B12 deficiency     Past Surgical History:  Procedure Laterality Date  . ABDOMINAL HYSTERECTOMY    . BREAST LUMPECTOMY    . CATARACT EXTRACTION      Allergies as of 10/16/2016   No Known Allergies     Medication List       Accurate as of 10/16/16 11:59 PM. Always use your most recent med list.          acetaminophen 500 MG tablet Commonly known as:  TYLENOL Take 500 mg by mouth. Take one tablet every 6 hours as needed for pain   Acidophilus Lactobacillus Caps Take  by mouth. Take one capsule by mouth two times a day with meals   amLODipine 10 MG tablet Commonly known as:  NORVASC Take 10 mg by mouth. Take one tablet daily for blood pressure   aspirin 325 MG tablet Take 325 mg by mouth. Take one tablet nightly   CALCIUM 600-D PO Take by mouth. Take one daily   Cholecalciferol 1000 units tablet Take 1,000 Units by mouth. Take one Vitamin D capsule daily   clopidogrel 75 MG tablet Commonly known as:  PLAVIX Take 75 mg by mouth. Take one tablet daily   glipiZIDE 2.5 MG 24 hr tablet Commonly known as:  GLUCOTROL XL Take 2.5 mg by mouth. Take one tablet daily   HUMALOG 100 UNIT/ML injection Generic drug:  insulin lispro Inject into the skin. Moderate SSI, hold after patient off of steroid   HYDROcodone-acetaminophen 5-325 MG tablet Commonly known as:  NORCO/VICODIN Take 1 tablet by mouth. Take one tablet every 6 hours as needed for pain up to 5 days   levofloxacin 500 MG tablet Commonly known as:  LEVAQUIN Take 500 mg by mouth. Take one tablet daily for 7 days for UTI   Lidocaine 4 % Ptch Apply topically. Place one patch on the skin daily to right knee   losartan 50 MG tablet Commonly known as:  COZAAR Take 50 mg  by mouth. Take one tablet daily   metFORMIN 1000 MG tablet Commonly known as:  GLUCOPHAGE Take 1,000 mg by mouth 2 (two) times daily with a meal.   metoCLOPramide 5 MG tablet Commonly known as:  REGLAN Take 5 mg by mouth. Take one tablet 3 times daily as needed up to 14 days   metoprolol tartrate 50 MG tablet Commonly known as:  LOPRESSOR Take 50 mg by mouth. Take one tablet twice daily for blood pressure   multivitamin tablet Take 1 tablet by mouth daily.   omeprazole 20 MG capsule Commonly known as:  PRILOSEC Take 20 mg by mouth. Take one tablet twice daily   ondansetron 4 MG tablet Commonly known as:  ZOFRAN Take 4 mg by mouth. Take one tablet every six hours as needed for nausea   oxybutynin 3.9  MG/24HR Commonly known as:  OXYTROL Place 1 patch onto the skin. Place one patch on the skin daily   polyvinyl alcohol 1.4 % ophthalmic solution Commonly known as:  LIQUIFILM TEARS 1 drop. One drop to both eyes four times a day   pravastatin 40 MG tablet Commonly known as:  PRAVACHOL Take 40 mg by mouth. Take one tablet nightly   predniSONE 10 MG tablet Commonly known as:  DELTASONE Take 10 mg by mouth. 2 tablets for next 3 days, then one tablet for 3 days, then stop. Use sliding scale insulin while on steroids   vitamin B-12 1000 MCG tablet Commonly known as:  CYANOCOBALAMIN Take 1,000 mcg by mouth. Take one Vitamin B-12 tablet daily       Meds ordered this encounter  Medications  . DISCONTD: levofloxacin (LEVAQUIN) 500 MG tablet    Sig: Take 500 mg by mouth. Take one tablet daily for 7 days for UTI    Immunization History  Administered Date(s) Administered  . Influenza-Unspecified 11/06/2015  . PPD Test 08/18/2016  . Pneumococcal-Unspecified 09/06/2015    Social History  Substance Use Topics  . Smoking status: Never Smoker  . Smokeless tobacco: Never Used  . Alcohol use No    Review of Systems  DATA OBTAINED: from Nurse GENERAL:  no fevers, fatigue, appetite changes SKIN: No itching, rash HEENT: No complaint RESPIRATORY: No cough, wheezing, SOB CARDIAC: No chest pain, palpitations, lower extremity edema  GI: No abdominal pain, No N/V/D or constipation, No heartburn or reflux  GU: No dysuria, frequency or urgency, or incontinence  MUSCULOSKELETAL: No unrelieved bone/joint pain NEUROLOGIC: No headache, dizziness  PSYCHIATRIC: No overt anxiety or sadness  Vitals:   10/16/16 1347  BP: (!) 142/72  Pulse: 72  Resp: 18  Temp: (!) 97.3 F (36.3 C)  SpO2: 95%   Body mass index is 20.96 kg/m. Physical Exam  GENERAL APPEARANCE: Alert, conversant, No acute distress  SKIN: No diaphoresis rash HEENT: Unremarkable RESPIRATORY: Breathing is even, unlabored.  Lung sounds are clear   CARDIOVASCULAR: Heart RRR no murmurs, rubs or gallops. No peripheral edema  GASTROINTESTINAL: Abdomen is soft, non-tender, not distended w/ normal bowel sounds.  GENITOURINARY: Bladder non tender, not distended  MUSCULOSKELETAL: No abnormal joints or musculature NEUROLOGIC: Cranial nerves 2-12 grossly intact. Moves all extremities PSYCHIATRIC: Mood and affect With dementia, no behavioral issues  Patient Active Problem List   Diagnosis Date Noted  . Sepsis ([place]) 10/28/2016  . Sacral decubitus ulcer, stage II 10/28/2016  . Acute urinary retention 10/28/2016  . GERD (gastroesophageal reflux disease) 10/28/2016  . Esophageal stricture 09/19/2016  . Vomiting and diarrhea 09/19/2016  . Bacterial overgrowth  syndrome 09/19/2016  . Inflammatory polyarthritis ([place]) 09/19/2016  . Septic arthritis of knee, right ([place]) 09/19/2016  . UTI (urinary tract infection) 09/19/2016  . Dysphagia 09/06/2016  . Arthritis 08/20/2016  . Acute encephalopathy 08/20/2016  . B12 deficiency 08/20/2016  . Acute diarrhea 08/20/2016  . History of stroke with residual deficit 08/20/2016  . Hyperlipidemia 08/20/2016  . Vascular dementia 08/20/2016  . Essential hypertension 08/20/2016  . Yeast infection involving the vagina and surrounding area 08/20/2016  . Effusion of right knee 08/18/2016  . Diabetes mellitus type 2, controlled, without complications ([place]) 15/94/5859    CMP     Component Value Date/Time   NA 141 10/11/2016   K 4.0 10/11/2016   BUN 18 10/11/2016   CREATININE 0.6 10/11/2016   AST 15 08/12/2016   ALT 9 08/12/2016   ALKPHOS 64 08/12/2016    Recent Labs  08/18/16 08/20/16 10/11/16  NA 141 138 141  K 3.5 4.4 4.0  BUN 17 21 18   CREATININE 0.7 0.7 0.6    Recent Labs  08/12/16  AST 15  ALT 9  ALKPHOS 64    Recent Labs  08/18/16 08/20/16 10/11/16  WBC 11.5 13.4 10.3  HGB 10.2* 11.2* 10.2*  HCT 30* 34* 32*  PLT 429* 495* 701*   No results for input(s):  CHOL, LDLCALC, TRIG in the last 8760 hours.  Invalid input(s): HCL No results found for: MICROALBUR No results found for: TSH No results found for: HGBA1C No results found for: CHOL, HDL, LDLCALC, LDLDIRECT, TRIG, CHOLHDL  Significant Diagnostic Results in last 30 days:  No results found.  Assessment and Plan  KLEBSIELLA PNEUMONIAE UTI-creatinine clearance calculated; have written for Levaquin 500 mg daily for 7 days and Florastor 250 mg twice a day for 14 days; we'll monitor her sponsor    Noah Delaine. Sheppard Coil, MD

## 2016-10-24 ENCOUNTER — Non-Acute Institutional Stay (SKILLED_NURSING_FACILITY): Payer: Medicare Other | Admitting: Internal Medicine

## 2016-10-24 ENCOUNTER — Encounter: Payer: Self-pay | Admitting: Internal Medicine

## 2016-10-24 DIAGNOSIS — K219 Gastro-esophageal reflux disease without esophagitis: Secondary | ICD-10-CM

## 2016-10-24 DIAGNOSIS — I10 Essential (primary) hypertension: Secondary | ICD-10-CM

## 2016-10-24 DIAGNOSIS — L89152 Pressure ulcer of sacral region, stage 2: Secondary | ICD-10-CM | POA: Diagnosis not present

## 2016-10-24 DIAGNOSIS — R338 Other retention of urine: Secondary | ICD-10-CM

## 2016-10-24 DIAGNOSIS — E119 Type 2 diabetes mellitus without complications: Secondary | ICD-10-CM | POA: Diagnosis not present

## 2016-10-24 DIAGNOSIS — N3000 Acute cystitis without hematuria: Secondary | ICD-10-CM | POA: Diagnosis not present

## 2016-10-24 DIAGNOSIS — G9340 Encephalopathy, unspecified: Secondary | ICD-10-CM

## 2016-10-24 DIAGNOSIS — A419 Sepsis, unspecified organism: Secondary | ICD-10-CM | POA: Diagnosis not present

## 2016-10-24 NOTE — Progress Notes (Signed)
: Provider:  Noah Delaine. Sheppard Coil, MD Location:  [facility] Room Number: 111 Place of Service:  SNF ([phone])  PCP: Hennie Duos, MD Patient Care Team: Hennie Duos, MD as PCP - General (Internal Medicine)  Extended Emergency Contact Information Primary Emergency Contact: Kahl,[name] Address: [address]          [place], [place] [address] [place] Phone: [phone] Relation: Son     Allergies: Patient has no known allergies.  Chief Complaint  Patient presents with  . Readmit To SNF    following hospitalization 10/18/16 to 10/23/16 acute encephalopathy    HPI: Patient is 83 y.o. female with dementia, diabetes, CVA with left hemiplegia, who has recently had extensive hospitalizations, mostly related to infection. She was admitted last month the hospital of right knee infection for which she was treated with PICC LINE and prolonged IV antibiotics. The patient was with dysphasia and patient was found to have esophageal stricture. Patient has also been struggling with recurrent UTIs. She has baseline dementia and worsening functional status after each hospitalization. Patient was found abnormal urine last week and prescribed with Levaquin and is still 10 still taking Levaquin. She has had further loose stool and was checked for C. difficile and GI pathogens were normal. Today patient was noted to have seizure-like episode with following of fusion which was most likely chills. Because patient was confused after her episode she was brought to the emergency department where she complains of body aches and her buttocks hurting patient was admitted to [facility] from 9/13-18 she was treated for sepsis due to UTI, multidrug resistance, and acute encephalopathy. Patient also has a stage II sacral ulcer. Hospital course was complicated by acute urinary retention, patient retaining up to 800 mL as a post void residual and a Foley  was placed. Patient is admitted back to skilled nursing facility with generalized weakness for OT/PT. While at skilled nursing facility patient will be followed for hypertension treated with Norvasc and Cozaar and metoprolol, diabetes mellitus 2 treated with glipizide and metformin and GERD treated with Prilosec. `  Past Medical History:  Diagnosis Date  . Acute encephalopathy 08/20/2016  . B12 deficiency 08/20/2016  . Breast cancer ([place])   . Diabetes mellitus type 2, controlled, without complications ([place]) 5852  . Dysphagia 09/06/2016  . Effusion of right knee 08/18/2016  . Essential hypertension   . History of stroke with residual deficit 08/20/2016  . Hyperlipidemia   . Hypomagnesemia   . Leucocytosis   . Macular degeneration   . Osteoarthritis of right knee   . Physical debility   . Stroke [facility]) 2011   left sided weakness baseline  . Urinary incontinence in female   . Vascular dementia   . Vitamin B12 deficiency     Past Surgical History:  Procedure Laterality Date  . ABDOMINAL HYSTERECTOMY    . BREAST LUMPECTOMY    . CATARACT EXTRACTION      Allergies as of 10/24/2016   No Known Allergies     Medication List       Accurate as of 10/24/16  9:14 AM. Always use your most recent med list.          acetaminophen 500 MG tablet Commonly known as:  TYLENOL Take 500 mg by mouth. Take one tablet every 6 hours as needed for pain   amLODipine 10 MG tablet Commonly known as:  NORVASC Take 10 mg by  mouth. Take one tablet daily for blood pressure   aspirin 325 MG tablet Take 325 mg by mouth. Take one tablet nightly   BIOFREEZE 4 % Gel Generic drug:  Menthol (Topical Analgesic) Apply topically. Apply one application topically to both knees 3 times daily as needed   CALCIUM 600-D PO Take by mouth. Take one daily   Cholecalciferol 1000 units tablet Take 1,000 Units by mouth. Take one Vitamin D capsule daily   clopidogrel 75 MG tablet Commonly known as:  PLAVIX Take  75 mg by mouth. Take one tablet daily   glipiZIDE 2.5 MG 24 hr tablet Commonly known as:  GLUCOTROL XL Take 2.5 mg by mouth. Take one tablet daily   Lidocaine 4 % Ptch Apply topically. Place one patch on the skin daily to right knee   losartan 50 MG tablet Commonly known as:  COZAAR Take 50 mg by mouth. Take one tablet daily   metFORMIN 1000 MG tablet Commonly known as:  GLUCOPHAGE Take 1,000 mg by mouth 2 (two) times daily with a meal.   metoprolol tartrate 50 MG tablet Commonly known as:  LOPRESSOR Take 50 mg by mouth. Take one tablet twice daily for blood pressure   multivitamin tablet Take 1 tablet by mouth daily.   nitrofurantoin 100 MG capsule Commonly known as:  MACRODANTIN Take 100 mg by mouth. Take one capsule 2 times daily for 10 days.   omeprazole 20 MG capsule Commonly known as:  PRILOSEC Take 20 mg by mouth. Take one tablet twice daily   ondansetron 4 MG tablet Commonly known as:  ZOFRAN Take 4 mg by mouth. Take one tablet every six hours as needed for nausea   oxybutynin 5 MG tablet Commonly known as:  DITROPAN Take 5 mg by mouth. Take one tablet 2 times daily   polyvinyl alcohol 1.4 % ophthalmic solution Commonly known as:  LIQUIFILM TEARS 1 drop. One drop to both eyes four times a day   pravastatin 40 MG tablet Commonly known as:  PRAVACHOL Take 40 mg by mouth. Take one tablet nightly   saccharomyces boulardii 250 MG capsule Commonly known as:  FLORASTOR Take 250 mg by mouth 2 (two) times daily.   vitamin B-12 1000 MCG tablet Commonly known as:  CYANOCOBALAMIN Take 1,000 mcg by mouth. Take one Vitamin B-12 tablet daily       Meds ordered this encounter  Medications  . nitrofurantoin (MACRODANTIN) 100 MG capsule    Sig: Take 100 mg by mouth. Take one capsule 2 times daily for 10 days.  . Menthol, Topical Analgesic, (BIOFREEZE) 4 % GEL    Sig: Apply topically. Apply one application topically to both knees 3 times daily as needed  .  oxybutynin (DITROPAN) 5 MG tablet    Sig: Take 5 mg by mouth. Take one tablet 2 times daily  . saccharomyces boulardii (FLORASTOR) 250 MG capsule    Sig: Take 250 mg by mouth 2 (two) times daily.    Immunization History  Administered Date(s) Administered  . Influenza-Unspecified 11/06/2015  . PPD Test 08/18/2016  . Pneumococcal-Unspecified 09/06/2015    Social History  Substance Use Topics  . Smoking status: Never Smoker  . Smokeless tobacco: Never Used  . Alcohol use No    Family history is   Family History  Problem Relation Age of Onset  . Hypertension Mother   . Macular degeneration Father   . Stroke Father   . Macular degeneration Sister       Review  of Systems  DATA OBTAINED: From nurse, family member  GENERAL:  no fevers, fatigue, appetite changes, Failing would like patient to have Glucerna  SKIN: No itching, or rash EYES: No eye pain, redness, discharge EARS: No earache, tinnitus, change in hearing NOSE: No congestion, drainage or bleeding  MOUTH/THROAT: No mouth or tooth pain, No sore throat RESPIRATORY: No cough, wheezing, SOB CARDIAC: No chest pain, palpitations, lower extremity edema  GI: No abdominal pain, No N/V/D or constipation, No heartburn or reflux  GU: No dysuria, frequency or urgency, or incontinence  MUSCULOSKELETAL: No unrelieved bone/joint pain NEUROLOGIC: No headache, dizziness or focal weakness PSYCHIATRIC: No c/o anxiety or sadness   Vitals:   10/24/16 0850  BP: 109/72  Pulse: 70  Resp: 18  Temp: 98.4 F (36.9 C)    SpO2 Readings from Last 1 Encounters:  10/16/16 95%   Body mass index is 20.67 kg/m.     Physical Exam  GENERAL APPEARANCE: Alert,  No acute distress.  SKIN: No diaphoresis rash HEAD: Normocephalic, atraumatic  EYES: Conjunctiva/lids clear. Pupils round, reactive. EOMs intact.  EARS: External exam WNL, canals clear. Hearing grossly normal.  NOSE: No deformity or discharge.  MOUTH/THROAT: Lips w/o lesions    RESPIRATORY: Breathing is even, unlabored. Lung sounds are clear   CARDIOVASCULAR: Heart RRR no murmurs, rubs or gallops. No peripheral edema.   GASTROINTESTINAL: Abdomen is soft, non-tender, not distended w/ normal bowel sounds. GENITOURINARY: Bladder non tender, not distended  MUSCULOSKELETAL: No abnormal joints or musculature NEUROLOGIC:  Cranial nerves 2-12 grossly intact; left hemiplegia PSYCHIATRIC: Mood and affect with dementia, no behavioral issues  Patient Active Problem List   Diagnosis Date Noted  . Esophageal stricture 09/19/2016  . Vomiting and diarrhea 09/19/2016  . Bacterial overgrowth syndrome 09/19/2016  . Inflammatory polyarthritis ([place]) 09/19/2016  . Septic arthritis of knee, right ([place]) 09/19/2016  . UTI (urinary tract infection) 09/19/2016  . Dysphagia 09/06/2016  . Arthritis 08/20/2016  . Acute encephalopathy 08/20/2016  . B12 deficiency 08/20/2016  . Acute diarrhea 08/20/2016  . History of stroke with residual deficit 08/20/2016  . Hyperlipidemia 08/20/2016  . Vascular dementia 08/20/2016  . Essential hypertension 08/20/2016  . Yeast infection involving the vagina and surrounding area 08/20/2016  . Effusion of right knee 08/18/2016  . Diabetes mellitus type 2, controlled, without complications ([place]) 85/27/7824      Labs reviewed: Basic Metabolic Panel:    Component Value Date/Time   NA 141 10/11/2016   K 4.0 10/11/2016   BUN 18 10/11/2016   CREATININE 0.6 10/11/2016   AST 15 08/12/2016   ALT 9 08/12/2016   ALKPHOS 64 08/12/2016     Recent Labs  08/18/16 08/20/16 10/11/16  NA 141 138 141  K 3.5 4.4 4.0  BUN 17 21 18   CREATININE 0.7 0.7 0.6   Liver Function Tests:  Recent Labs  08/12/16  AST 15  ALT 9  ALKPHOS 64   No results for input(s): LIPASE, AMYLASE in the last 8760 hours. No results for input(s): AMMONIA in the last 8760 hours. CBC:  Recent Labs  08/18/16 08/20/16 10/11/16  WBC 11.5 13.4 10.3  HGB 10.2* 11.2* 10.2*   HCT 30* 34* 32*  PLT 429* 495* 701*   Lipid No results for input(s): CHOL, HDL, LDLCALC, TRIG in the last 8760 hours.  Cardiac Enzymes: No results for input(s): CKTOTAL, CKMB, CKMBINDEX, TROPONINI in the last 8760 hours. BNP: No results for input(s): BNP in the last 8760 hours. No results found for: [facility]  No results found for: HGBA1C No results found for: TSH No results found for: VITAMINB12 No results found for: FOLATE No results found for: IRON, TIBC, FERRITIN  Imaging and Procedures obtained prior to SNF admission: Patient was never admitted.   Not all labs, radiology exams or other studies done during hospitalization come through on my EPIC note; however they are reviewed by me.    Assessment and Plan  SEPSIS/UTI/ACUTE ENCEPHALOPATHY-patient treated with Invanz, blood cultures were negative; urine culture did not grow any organism patient was on Levaquin 4 days prior to coming to the hospital; renal ultrasound does not show any obvious obstruction; patient will be discharged with nitrofurantoin SNF - admitted with generalized weakness for OT/PT plan to continue Macrobid 100 mg twice a day for 10 days  DM2-patient was maintained on sliding scale insulin while in the hospital with plans to put her back on her oral meds on discharge SNF - plan to restart glipizide 2.5 mg daily and metformin 1000 mg twice a day; will obtain a blood sugars  SACRAL DECUBITUS STAGE II SNF - wound care to follow  ACUTE URINARY RETENTION SNF -per family member patient had postvoid residuals anywhere from 600 800 mL; therefore Foley was placed; secondary to this issue will continue Foley until patient can be followed up by urology as an outpatient  HYPERTENSION SNF -well controlled today; plan to continue Norvasc 10 mg by mouth daily, metoprolol 75 mg twice a day and Cozaar 50 mg by mouth daily  GERD SNF - not stated as uncontrolled; plan to continue omeprazole 20 mg twice a day   Time  spent greater than 45 minutes ;> 50% of time with patient was spent reviewing records, labs, tests and studies, counseling and developing plan of care  Webb Silversmith D. Sheppard Coil, MD

## 2016-10-28 ENCOUNTER — Encounter: Payer: Self-pay | Admitting: Internal Medicine

## 2016-10-28 DIAGNOSIS — L89152 Pressure ulcer of sacral region, stage 2: Secondary | ICD-10-CM | POA: Insufficient documentation

## 2016-10-28 DIAGNOSIS — K219 Gastro-esophageal reflux disease without esophagitis: Secondary | ICD-10-CM | POA: Insufficient documentation

## 2016-10-28 DIAGNOSIS — A419 Sepsis, unspecified organism: Secondary | ICD-10-CM

## 2016-10-28 DIAGNOSIS — R338 Other retention of urine: Secondary | ICD-10-CM | POA: Insufficient documentation

## 2016-10-28 HISTORY — DX: Pressure ulcer of sacral region, stage 2: L89.152

## 2016-10-28 HISTORY — DX: Sepsis, unspecified organism: A41.9

## 2016-10-29 LAB — CBC AND DIFFERENTIAL
HEMATOCRIT: 28 — AB (ref 36–46)
Hemoglobin: 8.9 — AB (ref 12.0–16.0)
PLATELETS: 522 — AB (ref 150–399)
WBC: 13.3

## 2016-11-17 ENCOUNTER — Encounter: Payer: Self-pay | Admitting: Internal Medicine

## 2016-11-21 ENCOUNTER — Non-Acute Institutional Stay (SKILLED_NURSING_FACILITY): Payer: Medicare Other | Admitting: Internal Medicine

## 2016-11-21 ENCOUNTER — Encounter: Payer: Self-pay | Admitting: Internal Medicine

## 2016-11-21 DIAGNOSIS — F0150 Vascular dementia without behavioral disturbance: Secondary | ICD-10-CM | POA: Diagnosis not present

## 2016-11-21 DIAGNOSIS — I10 Essential (primary) hypertension: Secondary | ICD-10-CM

## 2016-11-21 DIAGNOSIS — I6930 Unspecified sequelae of cerebral infarction: Secondary | ICD-10-CM | POA: Diagnosis not present

## 2016-11-21 NOTE — Progress Notes (Signed)
Location:  [place] Room Number: [place] of Service:  SNF (31)  Hennie Duos, MD  Patient Care Team: Hennie Duos, MD as PCP - General (Internal Medicine)  Extended Emergency Contact Information Primary Emergency Contact: Ibsen,[name] Address: [address]          [place],  [address] [place] Phone: [phone] Relation: Son    Allergies: Patient has no known allergies.  Chief Complaint  Patient presents with  . Medical Management of Chronic Issues    routine visit  . Health maintenance    orders written for next lab do HgbA1c, schedule pt with foot doctor and eye doctor -in house.     HPI: Patient is 83 y.o. female who Who is being seen for routine issues of dementia, hypertension, and history of stroke with residual deficit.  Past Medical History:  Diagnosis Date  . Acute encephalopathy 08/20/2016  . B12 deficiency 08/20/2016  . Breast cancer ([place])   . Diabetes mellitus type 2, controlled, without complications ([place]) 9622  . Dysphagia 09/06/2016  . Effusion of right knee 08/18/2016  . Essential hypertension   . History of stroke with residual deficit 08/20/2016  . Hyperlipidemia   . Hypomagnesemia   . Leucocytosis   . Macular degeneration   . Osteoarthritis of right knee   . Physical debility   . Stroke [facility]) 2011   left sided weakness baseline  . Urinary incontinence in female   . Vascular dementia   . Vitamin B12 deficiency     Past Surgical History:  Procedure Laterality Date  . ABDOMINAL HYSTERECTOMY    . BREAST LUMPECTOMY    . CATARACT EXTRACTION      Allergies as of 11/21/2016   No Known Allergies     Medication List       Accurate as of 11/21/16 11:59 PM. Always use your most recent med list.          acetaminophen 500 MG tablet Commonly known as:  TYLENOL Take 500 mg by mouth. Take one tablet every 6 hours as needed for pain   amLODipine 10 MG  tablet Commonly known as:  NORVASC Take 10 mg by mouth. Take one tablet daily for blood pressure   aspirin 325 MG tablet Take 325 mg by mouth. Take one tablet nightly   BIOFREEZE 4 % Gel Generic drug:  Menthol (Topical Analgesic) Apply topically. Apply one application topically to both knees 3 times daily as needed   CALCIUM 600-D PO Take by mouth. Take one daily   Cholecalciferol 1000 units tablet Take 1,000 Units by mouth. Take one Vitamin D capsule daily   clopidogrel 75 MG tablet Commonly known as:  PLAVIX Take 75 mg by mouth. Take one tablet daily   glipiZIDE 2.5 MG 24 hr tablet Commonly known as:  GLUCOTROL XL Take 2.5 mg by mouth. Take one tablet daily   GLUCERNA Liqd Take by mouth. Drink three times daily between meals due to weight loss and wound healing   Lidocaine 4 % Ptch Apply topically. Place one patch on the skin daily to right knee   losartan 50 MG tablet Commonly known as:  COZAAR Take 50 mg by mouth. Take one tablet daily   metFORMIN 1000 MG tablet Commonly known as:  GLUCOPHAGE Take 1,000 mg by mouth 2 (two) times daily with a meal.   metoprolol tartrate 50 MG tablet Commonly known as:  LOPRESSOR Take 50 mg by  mouth. Take one and a half tablet (75 mg)  twice daily for blood pressure   multivitamin tablet Take 1 tablet by mouth daily.   omeprazole 20 MG capsule Commonly known as:  PRILOSEC Take 20 mg by mouth. Take one tablet twice daily   ondansetron 4 MG tablet Commonly known as:  ZOFRAN Take 4 mg by mouth. Take one tablet every six hours as needed for nausea   oxybutynin 5 MG tablet Commonly known as:  DITROPAN Take 5 mg by mouth. Take one tablet 2 times daily   polyvinyl alcohol 1.4 % ophthalmic solution Commonly known as:  LIQUIFILM TEARS 1 drop. One drop to both eyes four times a day   pravastatin 40 MG tablet Commonly known as:  PRAVACHOL Take 40 mg by mouth. Take one tablet nightly   saccharomyces boulardii 250 MG  capsule Commonly known as:  FLORASTOR Take 250 mg by mouth 2 (two) times daily.   vitamin B-12 1000 MCG tablet Commonly known as:  CYANOCOBALAMIN Take 1,000 mcg by mouth. Take one Vitamin B-12 tablet daily       Meds ordered this encounter  Medications  . GLUCERNA ([place]) LIQD    Sig: Take by mouth. Drink three times daily between meals due to weight loss and wound healing    Immunization History  Administered Date(s) Administered  . Influenza-Unspecified 11/06/2015  . PPD Test 08/18/2016  . Pneumococcal-Unspecified 09/06/2015    Social History  Substance Use Topics  . Smoking status: Never Smoker  . Smokeless tobacco: Never Used  . Alcohol use No    Review of Systems  DATA OBTAINED: from patient, nurse GENERAL:  no fevers, fatigue, appetite changes SKIN: No itching, rash HEENT: No complaint RESPIRATORY: No cough, wheezing, SOB CARDIAC: No chest pain, palpitations, lower extremity edema  GI: No abdominal pain, No N/V/D or constipation, No heartburn or reflux  GU: No dysuria, frequency or urgency, or incontinence  MUSCULOSKELETAL: No unrelieved bone/joint pain NEUROLOGIC: No headache, dizziness  PSYCHIATRIC: No overt anxiety or sadness  Vitals:   11/21/16 1200  BP: 116/69  Pulse: 84  Resp: 18  Temp: (!) 97.2 F (36.2 C)   Body mass index is 20.59 kg/m. Physical Exam  GENERAL APPEARANCE: Alert,  No acute distress  SKIN: No diaphoresis rash HEENT: Unremarkable RESPIRATORY: Breathing is even, unlabored. Lung sounds are clear   CARDIOVASCULAR: Heart RRR no murmurs, rubs or gallops. No peripheral edema  GASTROINTESTINAL: Abdomen is soft, non-tender, not distended w/ normal bowel sounds.  GENITOURINARY: Bladder non tender, not distended  MUSCULOSKELETAL: No abnormal joints or musculature NEUROLOGIC: Cranial nerves 2-12 grossly intact; Left-sided weakness PSYCHIATRIC: Mood and affect appropriate to situation with dementia, no behavioral issues  Patient  Active Problem List   Diagnosis Date Noted  . Sepsis ([place]) 10/28/2016  . Sacral decubitus ulcer, stage II 10/28/2016  . Acute urinary retention 10/28/2016  . GERD (gastroesophageal reflux disease) 10/28/2016  . Esophageal stricture 09/19/2016  . Vomiting and diarrhea 09/19/2016  . Bacterial overgrowth syndrome 09/19/2016  . Inflammatory polyarthritis ([place]) 09/19/2016  . Septic arthritis of knee, right ([place]) 09/19/2016  . UTI (urinary tract infection) 09/19/2016  . Dysphagia 09/06/2016  . Arthritis 08/20/2016  . Acute encephalopathy 08/20/2016  . B12 deficiency 08/20/2016  . Acute diarrhea 08/20/2016  . History of stroke with residual deficit 08/20/2016  . Hyperlipidemia 08/20/2016  . Vascular dementia 08/20/2016  . Essential hypertension 08/20/2016  . Yeast infection involving the vagina and surrounding area 08/20/2016  . Effusion of right  knee 08/18/2016  . Diabetes mellitus type 2, controlled, without complications ([place]) 83/33/8329    CMP     Component Value Date/Time   NA 141 10/11/2016   K 4.0 10/11/2016   BUN 18 10/11/2016   CREATININE 0.6 10/11/2016   AST 15 08/12/2016   ALT 9 08/12/2016   ALKPHOS 64 08/12/2016    Recent Labs  08/18/16 08/20/16 10/11/16  NA 141 138 141  K 3.5 4.4 4.0  BUN 17 21 18   CREATININE 0.7 0.7 0.6    Recent Labs  08/12/16  AST 15  ALT 9  ALKPHOS 64    Recent Labs  08/20/16 10/11/16 10/29/16  WBC 13.4 10.3 13.3  HGB 11.2* 10.2* 8.9*  HCT 34* 32* 28*  PLT 495* 701* 522*   No results for input(s): CHOL, LDLCALC, TRIG in the last 8760 hours.  Invalid input(s): HCL No results found for: MICROALBUR No results found for: TSH No results found for: HGBA1C No results found for: CHOL, HDL, LDLCALC, LDLDIRECT, TRIG, CHOLHDL  Significant Diagnostic Results in last 30 days:  No results found.  Assessment and Plan  Vascular dementia Chronic and stable; on no meds; plan to continue supportive care  Essential  hypertension Controlled; continue Norvasc 10 mg by mouth daily Lopressor 75 mg by mouth twice a day and Cozaar 50 mg by mouth daily  History of stroke with residual deficit stable; plan to continue ASA 325 mg by mouth daily, Plavix 75 mg by mouth daily as prophylaxis and statin Pravachol 40 mg daily with management     Webb Silversmith D. Sheppard Coil, MD

## 2016-11-22 ENCOUNTER — Encounter: Payer: Self-pay | Admitting: Internal Medicine

## 2016-11-22 NOTE — Assessment & Plan Note (Signed)
Chronic and stable; on no meds; plan to continue supportive care

## 2016-11-22 NOTE — Assessment & Plan Note (Signed)
Controlled; continue Norvasc 10 mg by mouth daily Lopressor 75 mg by mouth twice a day and Cozaar 50 mg by mouth daily

## 2016-11-22 NOTE — Assessment & Plan Note (Signed)
stable; plan to continue ASA 325 mg by mouth daily, Plavix 75 mg by mouth daily as prophylaxis and statin Pravachol 40 mg daily with management

## 2016-11-26 LAB — BASIC METABOLIC PANEL
BUN: 22 — AB (ref 4–21)
Creatinine: 0.6 (ref 0.5–1.1)
GLUCOSE: 151
POTASSIUM: 4.5 (ref 3.4–5.3)
Sodium: 137 (ref 137–147)

## 2016-11-26 LAB — LIPID PANEL
CHOLESTEROL: 164 (ref 0–200)
HDL: 57 (ref 35–70)
LDL CALC: 63
Triglycerides: 222 — AB (ref 40–160)

## 2016-11-26 LAB — VITAMIN B12: Vitamin B-12: 1055

## 2016-11-26 LAB — TSH: TSH: 2.58 (ref 0.41–5.90)

## 2016-11-26 LAB — CBC AND DIFFERENTIAL
HCT: 34 — AB (ref 36–46)
HEMOGLOBIN: 10.9 — AB (ref 12.0–16.0)
PLATELETS: 419 — AB (ref 150–399)
WBC: 17.2

## 2016-11-26 LAB — HEPATIC FUNCTION PANEL
ALK PHOS: 76 (ref 25–125)
ALT: 10 (ref 7–35)
AST: 15 (ref 13–35)
Bilirubin, Total: 0.2

## 2016-11-26 LAB — VITAMIN D 25 HYDROXY (VIT D DEFICIENCY, FRACTURES): VIT D 25 HYDROXY: 36.71

## 2016-11-26 LAB — HEMOGLOBIN A1C: HEMOGLOBIN A1C: 5.7

## 2016-11-27 ENCOUNTER — Encounter: Payer: Self-pay | Admitting: Internal Medicine

## 2016-11-27 ENCOUNTER — Non-Acute Institutional Stay (SKILLED_NURSING_FACILITY): Payer: Medicare Other | Admitting: Internal Medicine

## 2016-11-27 DIAGNOSIS — D72829 Elevated white blood cell count, unspecified: Secondary | ICD-10-CM

## 2016-11-27 DIAGNOSIS — Z960 Presence of urogenital implants: Secondary | ICD-10-CM | POA: Diagnosis not present

## 2016-11-27 DIAGNOSIS — Z978 Presence of other specified devices: Secondary | ICD-10-CM

## 2016-11-27 DIAGNOSIS — R339 Retention of urine, unspecified: Secondary | ICD-10-CM | POA: Diagnosis not present

## 2016-11-27 NOTE — Progress Notes (Signed)
Location:  [place] Room Number: [place] of Service:  SNF (31)  Hennie Duos, MD  Patient Care Team: Hennie Duos, MD as PCP - General (Internal Medicine)  Extended Emergency Contact Information Primary Emergency Contact: Dillinger,[name] Address: [address]          [place], [place] [address] [place] Phone: [phone] Relation: Son    Allergies: Patient has no known allergies.  Chief Complaint  Patient presents with  . Acute Visit    abnormal labs    HPI: Patient is 83 y.o. female who is being seen today because her white cell count on routine lab came back at 17.2. Patient does wear chronic Foley, nurses have not reported any unusual color or odor to urine. No reported fevers patient denies any pain or discomfort, she looks fine. Patient does have a chronic indwelling catheter for urinary retention.  Past Medical History:  Diagnosis Date  . Acute encephalopathy 08/20/2016  . B12 deficiency 08/20/2016  . Breast cancer ([place])   . Diabetes mellitus type 2, controlled, without complications ([place]) 4818  . Dysphagia 09/06/2016  . Effusion of right knee 08/18/2016  . Essential hypertension   . History of stroke with residual deficit 08/20/2016  . Hyperlipidemia   . Hypomagnesemia   . Leucocytosis   . Macular degeneration   . Osteoarthritis of right knee   . Physical debility   . Stroke [facility]) 2011   left sided weakness baseline  . Urinary incontinence in female   . Vascular dementia   . Vitamin B12 deficiency     Past Surgical History:  Procedure Laterality Date  . ABDOMINAL HYSTERECTOMY    . BREAST LUMPECTOMY    . CATARACT EXTRACTION      Allergies as of 11/27/2016   No Known Allergies     Medication List       Accurate as of 11/27/16  2:55 PM. Always use your most recent med list.          acetaminophen 500 MG tablet Commonly known as:  TYLENOL Take 500 mg by mouth. Take one tablet  every 6 hours as needed for pain   amLODipine 10 MG tablet Commonly known as:  NORVASC Take 10 mg by mouth. Take one tablet daily for blood pressure   aspirin 325 MG tablet Take 325 mg by mouth. Take one tablet nightly   BIOFREEZE 4 % Gel Generic drug:  Menthol (Topical Analgesic) Apply topically. Apply one application topically to both knees 3 times daily as needed   CALCIUM 600-D PO Take by mouth. Take one daily   Cholecalciferol 1000 units tablet Take 1,000 Units by mouth. Take one Vitamin D capsule daily   clopidogrel 75 MG tablet Commonly known as:  PLAVIX Take 75 mg by mouth. Take one tablet daily   glipiZIDE 2.5 MG 24 hr tablet Commonly known as:  GLUCOTROL XL Take 2.5 mg by mouth. Take one tablet daily   GLUCERNA Liqd Take by mouth. Drink three times daily between meals due to weight loss and wound healing   Lidocaine 4 % Ptch Apply topically. Place one patch on the skin daily to right knee   losartan 50 MG tablet Commonly known as:  COZAAR Take 50 mg by mouth. Take one tablet daily   metFORMIN 1000 MG tablet Commonly known as:  GLUCOPHAGE Take 1,000 mg by mouth 2 (two) times daily with a meal.   metoprolol tartrate 50 MG  tablet Commonly known as:  LOPRESSOR Take 50 mg by mouth. Take one and a half tablet (75 mg)  twice daily for blood pressure   multivitamin tablet Take 1 tablet by mouth daily.   omeprazole 20 MG capsule Commonly known as:  PRILOSEC Take 20 mg by mouth. Take one tablet twice daily   ondansetron 4 MG tablet Commonly known as:  ZOFRAN Take 4 mg by mouth. Take one tablet every six hours as needed for nausea   oxybutynin 5 MG tablet Commonly known as:  DITROPAN Take 5 mg by mouth. Take one tablet 2 times daily   polyvinyl alcohol 1.4 % ophthalmic solution Commonly known as:  LIQUIFILM TEARS 1 drop. One drop to both eyes four times a day   pravastatin 40 MG tablet Commonly known as:  PRAVACHOL Take 40 mg by mouth. Take one tablet  nightly   saccharomyces boulardii 250 MG capsule Commonly known as:  FLORASTOR Take 250 mg by mouth 2 (two) times daily.   vitamin B-12 1000 MCG tablet Commonly known as:  CYANOCOBALAMIN Take 1,000 mcg by mouth. Take one Vitamin B-12 tablet daily       No orders of the defined types were placed in this encounter.   Immunization History  Administered Date(s) Administered  . Influenza-Unspecified 11/06/2015, 11/26/2016  . PPD Test 08/18/2016  . Pneumococcal-Unspecified 09/06/2015    Social History  Substance Use Topics  . Smoking status: Never Smoker  . Smokeless tobacco: Never Used  . Alcohol use No    Review of Systems  DATA OBTAINED: from patient, nurse GENERAL:  no fevers, fatigue, appetite changes SKIN: No itching, rash HEENT: No complaint RESPIRATORY: No cough, wheezing, SOB CARDIAC: No chest pain, palpitations, lower extremity edema  GI: No abdominal pain, No N/V/D or constipation, No heartburn or reflux  GU: No dysuria, frequency or urgency, or incontinence  MUSCULOSKELETAL: No unrelieved bone/joint pain NEUROLOGIC: No headache, dizziness  PSYCHIATRIC: No overt anxiety or sadness  Vitals:   11/27/16 1446  BP: 126/72  Pulse: 75  Resp: 17  Temp: 98.3 F (36.8 C)   Body mass index is 20.12 kg/m. Physical Exam  GENERAL APPEARANCE: Alert,No acute distress  SKIN: No diaphoresis rash HEENT: Unremarkable RESPIRATORY: Breathing is even, unlabored. Lung sounds are clear   CARDIOVASCULAR: Heart RRR no murmurs, rubs or gallops. No peripheral edema  GASTROINTESTINAL: Abdomen is soft, non-tender, not distended w/ normal bowel sounds.  GENITOURINARY: Bladder non tender, not distended  MUSCULOSKELETAL: No abnormal joints or musculature NEUROLOGIC: Cranial nerves 2-12 grossly intact; eft side weakness PSYCHIATRIC: Mood and affect with dementia, no behavioral issues  Patient Active Problem List   Diagnosis Date Noted  . Sepsis ([place]) 10/28/2016  . Sacral  decubitus ulcer, stage II 10/28/2016  . Acute urinary retention 10/28/2016  . GERD (gastroesophageal reflux disease) 10/28/2016  . Esophageal stricture 09/19/2016  . Vomiting and diarrhea 09/19/2016  . Bacterial overgrowth syndrome 09/19/2016  . Inflammatory polyarthritis ([place]) 09/19/2016  . Septic arthritis of knee, right ([place]) 09/19/2016  . UTI (urinary tract infection) 09/19/2016  . Dysphagia 09/06/2016  . Arthritis 08/20/2016  . Acute encephalopathy 08/20/2016  . B12 deficiency 08/20/2016  . Acute diarrhea 08/20/2016  . History of stroke with residual deficit 08/20/2016  . Hyperlipidemia 08/20/2016  . Vascular dementia 08/20/2016  . Essential hypertension 08/20/2016  . Yeast infection involving the vagina and surrounding area 08/20/2016  . Effusion of right knee 08/18/2016  . Diabetes mellitus type 2, controlled, without complications ([place]) 62/13/0865  CMP     Component Value Date/Time   NA 141 10/11/2016   K 4.0 10/11/2016   BUN 18 10/11/2016   CREATININE 0.6 10/11/2016   AST 15 08/12/2016   ALT 9 08/12/2016   ALKPHOS 64 08/12/2016    Recent Labs  08/18/16 08/20/16 10/11/16  NA 141 138 141  K 3.5 4.4 4.0  BUN 17 21 18   CREATININE 0.7 0.7 0.6    Recent Labs  08/12/16  AST 15  ALT 9  ALKPHOS 64    Recent Labs  10/11/16 10/29/16 11/26/16  WBC 10.3 13.3 17.2  HGB 10.2* 8.9* 10.9*  HCT 32* 28* 34*  PLT 701* 522* 419*   No results for input(s): CHOL, LDLCALC, TRIG in the last 8760 hours.  Invalid input(s): HCL No results found for: MICROALBUR No results found for: TSH No results found for: HGBA1C No results found for: CHOL, HDL, LDLCALC, LDLDIRECT, TRIG, CHOLHDL  Significant Diagnostic Results in last 30 days:  No results found.  Assessment and Plan  ELEVATED WBC/URINARY RETENTION-patient has no symptoms referable to the; patient does have an indwelling Foley for urinary retention; possibly a UTI is cause for elevated white count; will obtain  urine for C&S; will continue monitor    Solveig Fangman D. Sheppard Coil, MD

## 2016-12-03 ENCOUNTER — Encounter: Payer: Self-pay | Admitting: Internal Medicine

## 2016-12-03 ENCOUNTER — Non-Acute Institutional Stay (SKILLED_NURSING_FACILITY): Payer: Medicare Other | Admitting: Internal Medicine

## 2016-12-03 DIAGNOSIS — N3090 Cystitis, unspecified without hematuria: Secondary | ICD-10-CM

## 2016-12-03 DIAGNOSIS — B9689 Other specified bacterial agents as the cause of diseases classified elsewhere: Secondary | ICD-10-CM

## 2016-12-03 DIAGNOSIS — B961 Klebsiella pneumoniae [K. pneumoniae] as the cause of diseases classified elsewhere: Secondary | ICD-10-CM

## 2016-12-03 NOTE — Progress Notes (Signed)
Location:  [place] Room Number: [place] of Service:  SNF (31)  Hennie Duos, MD  Patient Care Team: Hennie Duos, MD as PCP - General (Internal Medicine)  Extended Emergency Contact Information Primary Emergency Contact: Ra,[name] Address: [address]          [place], [place] [address] [place] Phone: [phone] Relation: Son    Allergies: Patient has no known allergies.  Chief Complaint  Patient presents with  . Acute Visit    UTI    HPI: Patient is 83 y.o. female who s being seen today because her urine grew out greater than 100,000 Klebsiella. Urinalysis was obtained from patient's WBC count was found to be 17.6 a few days earlier. Patient wears an indwelling Foley catheter due to urinary retention. Patient has had no fever or mental status changes or change in by mouth intake.  Past Medical History:  Diagnosis Date  . Acute encephalopathy 08/20/2016  . B12 deficiency 08/20/2016  . Breast cancer ([place])   . Diabetes mellitus type 2, controlled, without complications ([place]) 9147  . Dysphagia 09/06/2016  . Effusion of right knee 08/18/2016  . Essential hypertension   . History of stroke with residual deficit 08/20/2016  . Hyperlipidemia   . Hypomagnesemia   . Leucocytosis   . Macular degeneration   . Osteoarthritis of right knee   . Physical debility   . Stroke [facility]) 2011   left sided weakness baseline  . Urinary incontinence in female   . Vascular dementia   . Vitamin B12 deficiency     Past Surgical History:  Procedure Laterality Date  . ABDOMINAL HYSTERECTOMY    . BREAST LUMPECTOMY    . CATARACT EXTRACTION      Allergies as of 12/03/2016   No Known Allergies     Medication List       Accurate as of 12/03/16  5:00 PM. Always use your most recent med list.          acetaminophen 500 MG tablet Commonly known as:  TYLENOL Take 500 mg by mouth. Take one tablet every 6 hours as  needed for pain   amLODipine 10 MG tablet Commonly known as:  NORVASC Take 10 mg by mouth. Take one tablet daily for blood pressure   aspirin 325 MG tablet Take 325 mg by mouth. Take one tablet nightly   BIOFREEZE 4 % Gel Generic drug:  Menthol (Topical Analgesic) Apply topically. Apply one application topically to both knees 3 times daily as needed   CALCIUM 600-D PO Take by mouth. Take one daily   cefixime 200 MG/5ML suspension Commonly known as:  SUPRAX Take 200 mg by mouth. Take twice daily for 7 days stop 12/10/16   Cholecalciferol 1000 units tablet Take 1,000 Units by mouth. Take one Vitamin D capsule daily   clopidogrel 75 MG tablet Commonly known as:  PLAVIX Take 75 mg by mouth. Take one tablet daily   glipiZIDE 2.5 MG 24 hr tablet Commonly known as:  GLUCOTROL XL Take 2.5 mg by mouth. Take one tablet daily   GLUCERNA Liqd Take by mouth. Drink three times daily between meals due to weight loss and wound healing   Lidocaine 4 % Ptch Apply topically. Place one patch on the skin daily to right knee   losartan 50 MG tablet Commonly known as:  COZAAR Take 50 mg by mouth. Take one tablet daily   metFORMIN 1000 MG tablet  Commonly known as:  GLUCOPHAGE Take 1,000 mg by mouth 2 (two) times daily with a meal.   metoprolol tartrate 50 MG tablet Commonly known as:  LOPRESSOR Take 50 mg by mouth. Take one and a half tablet (75 mg)  twice daily for blood pressure   multivitamin tablet Take 1 tablet by mouth daily.   omeprazole 20 MG capsule Commonly known as:  PRILOSEC Take 20 mg by mouth. Take one tablet twice daily   ondansetron 4 MG tablet Commonly known as:  ZOFRAN Take 4 mg by mouth. Take one tablet every six hours as needed for nausea   oxybutynin 5 MG tablet Commonly known as:  DITROPAN Take 5 mg by mouth. Take one tablet 2 times daily   polyvinyl alcohol 1.4 % ophthalmic solution Commonly known as:  LIQUIFILM TEARS 1 drop. One drop to both eyes four  times a day   pravastatin 40 MG tablet Commonly known as:  PRAVACHOL Take 40 mg by mouth. Take one tablet nightly   saccharomyces boulardii 250 MG capsule Commonly known as:  FLORASTOR Take 250 mg by mouth 2 (two) times daily.   vitamin B-12 1000 MCG tablet Commonly known as:  CYANOCOBALAMIN Take 1,000 mcg by mouth. Take one Vitamin B-12 tablet daily       Meds ordered this encounter  Medications  . cefixime (SUPRAX) 200 MG/5ML suspension    Sig: Take 200 mg by mouth. Take twice daily for 7 days stop 12/10/16    Immunization History  Administered Date(s) Administered  . Influenza-Unspecified 11/06/2015, 11/26/2016  . PPD Test 08/18/2016  . Pneumococcal-Unspecified 09/06/2015    Social History  Substance Use Topics  . Smoking status: Never Smoker  . Smokeless tobacco: Never Used  . Alcohol use No    Review of Systems  DATA OBTAINED: from nurse GENERAL:  no fevers, fatigue, appetite changes SKIN: No itching, rash HEENT: No complaint RESPIRATORY: No cough, wheezing, SOB CARDIAC: No chest pain, palpitations, lower extremity edema  GI: No abdominal pain, No N/V/D or constipation, No heartburn or reflux  GU: No dysuria, frequency or urgency, or incontinence  MUSCULOSKELETAL: No unrelieved bone/joint pain NEUROLOGIC: No headache, dizziness  PSYCHIATRIC: No overt anxiety or sadness  Vitals:   12/03/16 1654  BP: 128/72  Pulse: 72  Resp: 18  Temp: 97.8 F (36.6 C)   Body mass index is 20.12 kg/m. Physical Exam  GENERAL APPEARANCE: Alert,  No acute distress  SKIN: No diaphoresis rash HEENT: Unremarkable RESPIRATORY: Breathing is even, unlabored. Lung sounds are clear   CARDIOVASCULAR: Heart RRR no murmurs, rubs or gallops. No peripheral edema  GASTROINTESTINAL: Abdomen is soft, non-tender, not distended w/ normal bowel sounds.  GENITOURINARY: Bladder non tender, not distended  MUSCULOSKELETAL: No abnormal joints or musculature NEUROLOGIC: Cranial nerves 2-12  grossly intact; left-sided weakness PSYCHIATRIC: Mood and affect appropriate with dementia, no behavioral issues  Patient Active Problem List   Diagnosis Date Noted  . Sepsis ([place]) 10/28/2016  . Sacral decubitus ulcer, stage II 10/28/2016  . Acute urinary retention 10/28/2016  . GERD (gastroesophageal reflux disease) 10/28/2016  . Esophageal stricture 09/19/2016  . Vomiting and diarrhea 09/19/2016  . Bacterial overgrowth syndrome 09/19/2016  . Inflammatory polyarthritis ([place]) 09/19/2016  . Septic arthritis of knee, right ([place]) 09/19/2016  . UTI (urinary tract infection) 09/19/2016  . Dysphagia 09/06/2016  . Arthritis 08/20/2016  . Acute encephalopathy 08/20/2016  . B12 deficiency 08/20/2016  . Acute diarrhea 08/20/2016  . History of stroke with residual deficit 08/20/2016  .  Hyperlipidemia 08/20/2016  . Vascular dementia 08/20/2016  . Essential hypertension 08/20/2016  . Yeast infection involving the vagina and surrounding area 08/20/2016  . Effusion of right knee 08/18/2016  . Diabetes mellitus type 2, controlled, without complications ([place]) 94/08/6806    CMP     Component Value Date/Time   NA 141 10/11/2016   K 4.0 10/11/2016   BUN 18 10/11/2016   CREATININE 0.6 10/11/2016   AST 15 08/12/2016   ALT 9 08/12/2016   ALKPHOS 64 08/12/2016    Recent Labs  08/18/16 08/20/16 10/11/16  NA 141 138 141  K 3.5 4.4 4.0  BUN 17 21 18   CREATININE 0.7 0.7 0.6    Recent Labs  08/12/16  AST 15  ALT 9  ALKPHOS 64    Recent Labs  10/11/16 10/29/16 11/26/16  WBC 10.3 13.3 17.2  HGB 10.2* 8.9* 10.9*  HCT 32* 28* 34*  PLT 701* 522* 419*   No results for input(s): CHOL, LDLCALC, TRIG in the last 8760 hours.  Invalid input(s): HCL No results found for: MICROALBUR No results found for: TSH No results found for: HGBA1C No results found for: CHOL, HDL, LDLCALC, LDLDIRECT, TRIG, CHOLHDL  Significant Diagnostic Results in last 30 days:  No results found.  Assessment and  Plan  KLEBSIELLA UTI; may need to consider if patient is calm eyes with Klebsiella-patient had a Klebsiella UTI back in early  September; with this UTI patient's greater than 100,000 Klebsiella sensitive to cephalosporins and accompanied by white count of 17.2; patient's calculated creatinine clearance is 50.07; will treat with Suprax 200 mg twice a day for 7 days; may need to consider patient is colonized with Klebsiella    Webb Silversmith D. Sheppard Coil, MD

## 2016-12-09 ENCOUNTER — Encounter: Payer: Self-pay | Admitting: Internal Medicine

## 2016-12-13 ENCOUNTER — Non-Acute Institutional Stay (SKILLED_NURSING_FACILITY): Payer: Medicare Other

## 2016-12-13 DIAGNOSIS — Z0000 Encounter for general adult medical examination without abnormal findings: Secondary | ICD-10-CM

## 2016-12-13 NOTE — Progress Notes (Signed)
Subjective:   Lauren Dennis is a 83 y.o. female who presents for Medicare Annual (Subsequent) preventive examination at [facility] term [facility]    Objective:     Vitals: BP 134/70 (BP Location: Right Arm, Patient Position: Sitting)   Pulse 63   Temp 97.8 F (36.6 C) (Oral)   Ht 5\' 2"  (1.575 m)   Wt 110 lb 9.6 oz (50.2 kg)   SpO2 98%   BMI 20.23 kg/m   Body mass index is 20.23 kg/m.   Tobacco Social History   Tobacco Use  Smoking Status Never Smoker  Smokeless Tobacco Never Used     Counseling given: Not Answered   Past Medical History:  Diagnosis Date  . Acute encephalopathy 08/20/2016  . B12 deficiency 08/20/2016  . Breast cancer ([place])   . Diabetes mellitus type 2, controlled, without complications ([place]) 5176  . Dysphagia 09/06/2016  . Effusion of right knee 08/18/2016  . Essential hypertension   . History of stroke with residual deficit 08/20/2016  . Hyperlipidemia   . Hypomagnesemia   . Leucocytosis   . Macular degeneration   . Osteoarthritis of right knee   . Physical debility   . Stroke [facility]) 2011   left sided weakness baseline  . Urinary incontinence in female   . Vascular dementia   . Vitamin B12 deficiency    Past Surgical History:  Procedure Laterality Date  . ABDOMINAL HYSTERECTOMY    . BREAST LUMPECTOMY    . CATARACT EXTRACTION     Family History  Problem Relation Age of Onset  . Hypertension Mother   . Macular degeneration Father   . Stroke Father   . Macular degeneration Sister    Social History   Substance and Sexual Activity  Sexual Activity No    Outpatient Encounter Medications as of 12/13/2016  Medication Sig  . acetaminophen (TYLENOL) 500 MG tablet Take 500 mg by mouth. Take one tablet every 6 hours as needed for pain  . amLODipine (NORVASC) 10 MG tablet Take 10 mg by mouth. Take one tablet daily for blood pressure  . aspirin 325 MG tablet Take 325 mg by mouth. Take one tablet nightly  . Calcium Carb-Cholecalciferol  (CALCIUM 600-D PO) Take by mouth. Take one daily  . cefixime (SUPRAX) 200 MG/5ML suspension Take 200 mg by mouth. Take twice daily for 7 days stop 12/10/16  . Cholecalciferol 1000 units tablet Take 1,000 Units by mouth. Take one Vitamin D capsule daily  . clopidogrel (PLAVIX) 75 MG tablet Take 75 mg by mouth. Take one tablet daily  . glipiZIDE (GLUCOTROL XL) 2.5 MG 24 hr tablet Take 2.5 mg by mouth. Take one tablet daily  . GLUCERNA ([place]) LIQD Take by mouth. Drink three times daily between meals due to weight loss and wound healing  . Lidocaine 4 % PTCH Apply topically. Place one patch on the skin daily to right knee  . losartan (COZAAR) 50 MG tablet Take 50 mg by mouth. Take one tablet daily  . Menthol, Topical Analgesic, (BIOFREEZE) 4 % GEL Apply topically. Apply one application topically to both knees 3 times daily as needed  . metFORMIN (GLUCOPHAGE) 1000 MG tablet Take 1,000 mg by mouth 2 (two) times daily with a meal.  . metoprolol tartrate (LOPRESSOR) 50 MG tablet Take 50 mg by mouth. Take one and a half tablet (75 mg)  twice daily for blood pressure  . Multiple Vitamin (MULTIVITAMIN) tablet Take 1 tablet by mouth daily.  [place] omeprazole (PRILOSEC)  20 MG capsule Take 20 mg by mouth. Take one tablet twice daily  . ondansetron (ZOFRAN) 4 MG tablet Take 4 mg by mouth. Take one tablet every six hours as needed for nausea  . oxybutynin (DITROPAN) 5 MG tablet Take 5 mg by mouth. Take one tablet 2 times daily  . polyvinyl alcohol (LIQUIFILM TEARS) 1.4 % ophthalmic solution 1 drop. One drop to both eyes four times a day  . pravastatin (PRAVACHOL) 40 MG tablet Take 40 mg by mouth. Take one tablet nightly  . saccharomyces boulardii (FLORASTOR) 250 MG capsule Take 250 mg by mouth 2 (two) times daily.  . vitamin B-12 (CYANOCOBALAMIN) 1000 MCG tablet Take 1,000 mcg by mouth. Take one Vitamin B-12 tablet daily   No facility-administered encounter medications on file as of 12/13/2016.     Activities of  Daily Living In your present state of health, do you have any difficulty performing the following activities: 12/13/2016  Hearing? N  Vision? Y  Difficulty concentrating or making decisions? N  Walking or climbing stairs? Y  Dressing or bathing? Y  Doing errands, shopping? Y  Preparing Food and eating ? Y  Using the Toilet? Y  In the past six months, have you accidently leaked urine? Y  Comment catheter  Do you have problems with loss of bowel control? N  Managing your Medications? Y  Managing your Finances? Y  Housekeeping or managing your Housekeeping? Y    Patient Care Team: Hennie Duos, MD as PCP - General (Internal Medicine)    Assessment:      Exercise Activities and Dietary recommendations Current Exercise Habits: The patient does not participate in regular exercise at present, Exercise limited by: orthopedic condition(s)  Goals    None     Fall Risk Fall Risk  12/13/2016  Falls in the past year? No   Depression Screen PHQ 2/9 Scores 12/13/2016  PHQ - 2 Score 0     Cognitive Function     6CIT Screen 12/13/2016  What Year? 0 points  What month? 0 points  What time? 0 points  Count back from 20 0 points  Months in reverse 4 points  Repeat phrase 6 points  Total Score 10    Immunization History  Administered Date(s) Administered  . Influenza-Unspecified 11/06/2015, 11/26/2016  . PPD Test 08/18/2016  . Pneumococcal-Unspecified 09/06/2015   Screening Tests Health Maintenance  Topic Date Due  . HEMOGLOBIN A1C  07-22-27  . FOOT EXAM  06/13/1935  . OPHTHALMOLOGY EXAM  06[birth year]13/1937  . TETANUS/TDAP  06/14/1946  . DEXA SCAN  06/13/1992  . PNA vac Low Risk Adult (2 of 2 - PCV13) 09/05/2016  . INFLUENZA VACCINE  Completed      Plan:    I have personally reviewed and addressed the Medicare Annual Wellness questionnaire and have noted the following in the patient's chart:  A. Medical and social history B. Use of alcohol, tobacco or illicit drugs    C. Current medications and supplements D. Functional ability and status E.  Nutritional status F.  Physical activity G. Advance directives H. List of other physicians I.  Hospitalizations, surgeries, and ER visits in previous 12 months J.  [place] to include hearing, vision, cognitive, depression L. Referrals and appointments - none  In addition, I have reviewed and discussed with patient certain preventive protocols, quality metrics, and best practice recommendations. A written personalized care plan for preventive services as well as general preventive health recommendations were provided to patient.  See attached scanned questionnaire for additional information.   Signed,   Rich Reining, RN Nurse Health Advisor   Quick Notes   Health Maintenance: HgA1C, foot exam, tdap, dexa, pna 13 due and ordered.     Abnormal Screen: 6 CIT-10     Patient Concerns: Would like to go to previous dentist-social work notified for transportation     Nurse Concerns: Frequent use of antibiotics, patient may benefit from probiotics.

## 2016-12-13 NOTE — Patient Instructions (Signed)
Lauren Dennis , Thank you for taking time to come for your Medicare Wellness Visit. I appreciate your ongoing commitment to your health goals. Please review the following plan we discussed and let me know if I can assist you in the future.   Screening recommendations/referrals: Colonoscopy excluded, pt is over age 81 Mammogram excluded, pt is over age 86 Bone Density due, ordered Recommended yearly ophthalmology/optometry visit for glaucoma screening and checkup Recommended yearly dental visit for hygiene and checkup  Vaccinations: Influenza vaccine up to date. Due 2019 fall season Pneumococcal vaccine 13 due, ordered Tdap vaccine due, ordered Shingles vaccine not in records   Advanced directives: In Chart  Conditions/risks identified: None  Next appointment: Dr. Sheppard Coil makes rounds   Preventive Care 65 Years and Older, Female Preventive care refers to lifestyle choices and visits with your health care provider that can promote health and wellness. What does preventive care include?  A yearly physical exam. This is also called an annual well check.  Dental exams once or twice a year.  Routine eye exams. Ask your health care provider how often you should have your eyes checked.  Personal lifestyle choices, including:  Daily care of your teeth and gums.  Regular physical activity.  Eating a healthy diet.  Avoiding tobacco and drug use.  Limiting alcohol use.  Practicing safe sex.  Taking low-dose aspirin every day.  Taking vitamin and mineral supplements as recommended by your health care provider. What happens during an annual well check? The services and screenings done by your health care provider during your annual well check will depend on your age, overall health, lifestyle risk factors, and family history of disease. Counseling  Your health care provider may ask you questions about your:  Alcohol use.  Tobacco use.  Drug use.  Emotional  well-being.  Home and relationship well-being.  Sexual activity.  Eating habits.  History of falls.  Memory and ability to understand (cognition).  Work and work Statistician.  Reproductive health. Screening  You may have the following tests or measurements:  Height, weight, and BMI.  Blood pressure.  Lipid and cholesterol levels. These may be checked every 5 years, or more frequently if you are over 12 years old.  Skin check.  Lung cancer screening. You may have this screening every year starting at age 69 if you have a 30-pack-year history of smoking and currently smoke or have quit within the past 15 years.  Fecal occult blood test (FOBT) of the stool. You may have this test every year starting at age 45.  Flexible sigmoidoscopy or colonoscopy. You may have a sigmoidoscopy every 5 years or a colonoscopy every 10 years starting at age 77.  Hepatitis C blood test.  Hepatitis B blood test.  Sexually transmitted disease (STD) testing.  Diabetes screening. This is done by checking your blood sugar (glucose) after you have not eaten for a while (fasting). You may have this done every 1-3 years.  Bone density scan. This is done to screen for osteoporosis. You may have this done starting at age 62.  Mammogram. This may be done every 1-2 years. Talk to your health care provider about how often you should have regular mammograms. Talk with your health care provider about your test results, treatment options, and if necessary, the need for more tests. Vaccines  Your health care provider may recommend certain vaccines, such as:  Influenza vaccine. This is recommended every year.  Tetanus, diphtheria, and acellular pertussis (Tdap, Td) vaccine. You  may need a Td booster every 10 years.  Zoster vaccine. You may need this after age 27.  Pneumococcal 13-valent conjugate (PCV13) vaccine. One dose is recommended after age 30.  Pneumococcal polysaccharide (PPSV23) vaccine. One  dose is recommended after age 65. Talk to your health care provider about which screenings and vaccines you need and how often you need them. This information is not intended to replace advice given to you by your health care provider. Make sure you discuss any questions you have with your health care provider. Document Released: 02/18/2015 Document Revised: 10/12/2015 Document Reviewed: 11/23/2014 Elsevier Interactive Patient Education  2017 [place] Prevention in the Home Falls can cause injuries. They can happen to people of all ages. There are many things you can do to make your home safe and to help prevent falls. What can I do on the outside of my home?  Regularly fix the edges of walkways and driveways and fix any cracks.  Remove anything that might make you trip as you walk through a door, such as a raised step or threshold.  Trim any bushes or trees on the path to your home.  Use bright outdoor lighting.  Clear any walking paths of anything that might make someone trip, such as rocks or tools.  Regularly check to see if handrails are loose or broken. Make sure that both sides of any steps have handrails.  Any raised decks and porches should have guardrails on the edges.  Have any leaves, snow, or ice cleared regularly.  Use sand or salt on walking paths during winter.  Clean up any spills in your garage right away. This includes oil or grease spills. What can I do in the bathroom?  Use night lights.  Install grab bars by the toilet and in the tub and shower. Do not use towel bars as grab bars.  Use non-skid mats or decals in the tub or shower.  If you need to sit down in the shower, use a plastic, non-slip stool.  Keep the floor dry. Clean up any water that spills on the floor as soon as it happens.  Remove soap buildup in the tub or shower regularly.  Attach bath mats securely with double-sided non-slip rug tape.  Do not have throw rugs and other  things on the floor that can make you trip. What can I do in the bedroom?  Use night lights.  Make sure that you have a light by your bed that is easy to reach.  Do not use any sheets or blankets that are too big for your bed. They should not hang down onto the floor.  Have a firm chair that has side arms. You can use this for support while you get dressed.  Do not have throw rugs and other things on the floor that can make you trip. What can I do in the kitchen?  Clean up any spills right away.  Avoid walking on wet floors.  Keep items that you use a lot in easy-to-reach places.  If you need to reach something above you, use a strong step stool that has a grab bar.  Keep electrical cords out of the way.  Do not use floor polish or wax that makes floors slippery. If you must use wax, use non-skid floor wax.  Do not have throw rugs and other things on the floor that can make you trip. What can I do with my stairs?  Do not leave any items  on the stairs.  Make sure that there are handrails on both sides of the stairs and use them. Fix handrails that are broken or loose. Make sure that handrails are as long as the stairways.  Check any carpeting to make sure that it is firmly attached to the stairs. Fix any carpet that is loose or worn.  Avoid having throw rugs at the top or bottom of the stairs. If you do have throw rugs, attach them to the floor with carpet tape.  Make sure that you have a light switch at the top of the stairs and the bottom of the stairs. If you do not have them, ask someone to add them for you. What else can I do to help prevent falls?  Wear shoes that:  Do not have high heels.  Have rubber bottoms.  Are comfortable and fit you well.  Are closed at the toe. Do not wear sandals.  If you use a stepladder:  Make sure that it is fully opened. Do not climb a closed stepladder.  Make sure that both sides of the stepladder are locked into place.  Ask  someone to hold it for you, if possible.  Clearly mark and make sure that you can see:  Any grab bars or handrails.  First and last steps.  Where the edge of each step is.  Use tools that help you move around (mobility aids) if they are needed. These include:  Canes.  Walkers.  Scooters.  Crutches.  Turn on the lights when you go into a dark area. Replace any light bulbs as soon as they burn out.  Set up your furniture so you have a clear path. Avoid moving your furniture around.  If any of your floors are uneven, fix them.  If there are any pets around you, be aware of where they are.  Review your medicines with your doctor. Some medicines can make you feel dizzy. This can increase your chance of falling. Ask your doctor what other things that you can do to help prevent falls. This information is not intended to replace advice given to you by your health care provider. Make sure you discuss any questions you have with your health care provider. Document Released: 11/18/2008 Document Revised: 06/30/2015 Document Reviewed: 02/26/2014 Elsevier Interactive Patient Education  2017 [employer].

## 2016-12-26 ENCOUNTER — Encounter: Payer: Self-pay | Admitting: Internal Medicine

## 2016-12-26 ENCOUNTER — Non-Acute Institutional Stay (SKILLED_NURSING_FACILITY): Payer: Medicare Other | Admitting: Internal Medicine

## 2016-12-26 DIAGNOSIS — E785 Hyperlipidemia, unspecified: Secondary | ICD-10-CM | POA: Diagnosis not present

## 2016-12-26 DIAGNOSIS — I10 Essential (primary) hypertension: Secondary | ICD-10-CM

## 2016-12-26 DIAGNOSIS — E538 Deficiency of other specified B group vitamins: Secondary | ICD-10-CM

## 2016-12-26 NOTE — Progress Notes (Signed)
Location:  [facility] Room Number: [place] of Service:  SNF (31)  Hennie Duos, MD  Patient Care Team: Hennie Duos, MD as PCP - General (Internal Medicine)  Extended Emergency Contact Information Primary Emergency Contact: Batchelder,[name] Address: [address]          [place], [place] [address] [place] Phone: [phone] Relation: Son    Allergies: Patient has no known allergies.  Chief Complaint  Patient presents with  . Medical Management of Chronic Issues    routine visit    HPI: Patient is 83 y.o. female who is being seen for routine issues of vitamin B12 deficiency, hyperlipidemia, and hypertension.  Past Medical History:  Diagnosis Date  . Acute encephalopathy 08/20/2016  . B12 deficiency 08/20/2016  . Breast cancer ([place])   . Diabetes mellitus type 2, controlled, without complications ([place]) 7846  . Dysphagia 09/06/2016  . Effusion of right knee 08/18/2016  . Essential hypertension   . History of stroke with residual deficit 08/20/2016  . Hyperlipidemia   . Hypomagnesemia   . Leucocytosis   . Macular degeneration   . Osteoarthritis of right knee   . Physical debility   . Stroke [facility]) 2011   left sided weakness baseline  . Urinary incontinence in female   . Vascular dementia   . Vitamin B12 deficiency     Past Surgical History:  Procedure Laterality Date  . ABDOMINAL HYSTERECTOMY    . BREAST LUMPECTOMY    . CATARACT EXTRACTION      Allergies as of 12/26/2016   No Known Allergies     Medication List        Accurate as of 12/26/16 11:59 PM. Always use your most recent med list.          acetaminophen 500 MG tablet Commonly known as:  TYLENOL Take 500 mg by mouth. Take one tablet every 6 hours as needed for pain   amLODipine 10 MG tablet Commonly known as:  NORVASC Take 10 mg by mouth. Take one tablet daily for blood pressure   aspirin 325 MG tablet Take 325 mg by mouth.  Take one tablet nightly   BIOFREEZE 4 % Gel Generic drug:  Menthol (Topical Analgesic) Apply topically. Apply one application topically to both knees 3 times daily as needed   CALCIUM 600-D PO Take by mouth. Take one daily   cefixime 200 MG/5ML suspension Commonly known as:  SUPRAX Take 200 mg by mouth. Take twice daily for 7 days stop 12/10/16   Cholecalciferol 1000 units tablet Take 1,000 Units by mouth. Take one Vitamin D capsule daily   clopidogrel 75 MG tablet Commonly known as:  PLAVIX Take 75 mg by mouth. Take one tablet daily   glipiZIDE 2.5 MG 24 hr tablet Commonly known as:  GLUCOTROL XL Take 2.5 mg by mouth. Take one tablet daily   GLUCERNA Liqd Take by mouth. Drink three times daily between meals due to weight loss and wound healing   Lidocaine 4 % Ptch Apply topically. Place one patch on the skin daily to right knee   losartan 50 MG tablet Commonly known as:  COZAAR Take 50 mg by mouth. Take one tablet daily   metFORMIN 1000 MG tablet Commonly known as:  GLUCOPHAGE Take 1,000 mg by mouth 2 (two) times daily with a meal.   metoprolol tartrate 50 MG tablet Commonly known as:  LOPRESSOR Take 50 mg by mouth. Take one and  a half tablet (75 mg)  twice daily for blood pressure   multivitamin tablet Take 1 tablet by mouth daily.   omeprazole 20 MG capsule Commonly known as:  PRILOSEC Take 20 mg by mouth. Take one tablet twice daily   ondansetron 4 MG tablet Commonly known as:  ZOFRAN Take 4 mg by mouth. Take one tablet every six hours as needed for nausea   oxybutynin 5 MG tablet Commonly known as:  DITROPAN Take 5 mg by mouth. Take one tablet 2 times daily   polyvinyl alcohol 1.4 % ophthalmic solution Commonly known as:  LIQUIFILM TEARS 1 drop. One drop to both eyes four times a day   pravastatin 40 MG tablet Commonly known as:  PRAVACHOL Take 40 mg by mouth. Take one tablet nightly   saccharomyces boulardii 250 MG capsule Commonly known as:   FLORASTOR Take 250 mg by mouth 2 (two) times daily.   vitamin B-12 1000 MCG tablet Commonly known as:  CYANOCOBALAMIN Take 1,000 mcg by mouth. Take one Vitamin B-12 tablet daily       No orders of the defined types were placed in this encounter.   Immunization History  Administered Date(s) Administered  . Influenza-Unspecified 11/06/2015, 11/26/2016  . PPD Test 08/18/2016  . Pneumococcal Conjugate-13 12/14/2016  . Pneumococcal-Unspecified 09/06/2015  . Tdap 12/15/2016    Social History   Tobacco Use  . Smoking status: Never Smoker  . Smokeless tobacco: Never Used  Substance Use Topics  . Alcohol use: No    Review of Systems  DATA OBTAINED: from patient, nurse GENERAL:  no fevers, fatigue, appetite changes SKIN: No itching, rash HEENT: No complaint RESPIRATORY: No cough, wheezing, SOB CARDIAC: No chest pain, palpitations, lower extremity edema  GI: No abdominal pain, No N/V/D or constipation, No heartburn or reflux  GU: No dysuria, frequency or urgency, or incontinence  MUSCULOSKELETAL: No unrelieved bone/joint pain NEUROLOGIC: No headache, dizziness  PSYCHIATRIC: No overt anxiety or sadness  Vitals:   12/26/16 1508  BP: 122/63  Pulse: 78  Resp: 18  Temp: 97.6 F (36.4 C)   Body mass index is 21.03 kg/m. Physical Exam  GENERAL APPEARANCE: Alert, No acute distress  SKIN: No diaphoresis rash HEENT: Unremarkable RESPIRATORY: Breathing is even, unlabored. Lung sounds are clear   CARDIOVASCULAR: Heart RRR no murmurs, rubs or gallops. No peripheral edema  GASTROINTESTINAL: Abdomen is soft, non-tender, not distended w/ normal bowel sounds.  GENITOURINARY: Bladder non tender, not distended  MUSCULOSKELETAL: No abnormal joints or musculature NEUROLOGIC: Cranial nerves 2-12 grossly intact; left-sided weakness PSYCHIATRIC: Dementia, no behavioral issues  Patient Active Problem List   Diagnosis Date Noted  . Sepsis ([place]) 10/28/2016  . Sacral decubitus ulcer,  stage II 10/28/2016  . Acute urinary retention 10/28/2016  . GERD (gastroesophageal reflux disease) 10/28/2016  . Esophageal stricture 09/19/2016  . Vomiting and diarrhea 09/19/2016  . Bacterial overgrowth syndrome 09/19/2016  . Inflammatory polyarthritis ([place]) 09/19/2016  . Septic arthritis of knee, right ([place]) 09/19/2016  . UTI (urinary tract infection) 09/19/2016  . Dysphagia 09/06/2016  . Arthritis 08/20/2016  . Acute encephalopathy 08/20/2016  . B12 deficiency 08/20/2016  . Acute diarrhea 08/20/2016  . History of stroke with residual deficit 08/20/2016  . Hyperlipidemia 08/20/2016  . Vascular dementia 08/20/2016  . Essential hypertension 08/20/2016  . Yeast infection involving the vagina and surrounding area 08/20/2016  . Effusion of right knee 08/18/2016  . Diabetes mellitus type 2, controlled, without complications ([place]) 62/22/9798    CMP  Component Value Date/Time   NA 141 10/11/2016   K 4.0 10/11/2016   BUN 18 10/11/2016   CREATININE 0.6 10/11/2016   AST 15 08/12/2016   ALT 9 08/12/2016   ALKPHOS 64 08/12/2016   Recent Labs    08/18/16 08/20/16 10/11/16  NA 141 138 141  K 3.5 4.4 4.0  BUN 17 21 18   CREATININE 0.7 0.7 0.6   Recent Labs    08/12/16  AST 15  ALT 9  ALKPHOS 64   Recent Labs    10/11/16 10/29/16 11/26/16  WBC 10.3 13.3 17.2  HGB 10.2* 8.9* 10.9*  HCT 32* 28* 34*  PLT 701* 522* 419*   No results for input(s): CHOL, LDLCALC, TRIG in the last 8760 hours.  Invalid input(s): HCL No results found for: MICROALBUR No results found for: TSH No results found for: HGBA1C No results found for: CHOL, HDL, LDLCALC, LDLDIRECT, TRIG, CHOLHDL  Significant Diagnostic Results in last 30 days:  No results found.  Assessment and Plan  B12 deficiency Time for the level; plan to continue replacement thousand micrograms by mouth daily   Hyperlipidemia Time for new labs; in the meantime we'll continue Pravachol 40 mg by mouth daily  Essential  hypertension Controlled; continue Cozaar 50 mg daily, Lopressor 5 mg twice a day and Norvasc 10 mg by mouth daily    Mylo Driskill D. Sheppard Coil, MD

## 2016-12-28 ENCOUNTER — Encounter: Payer: Self-pay | Admitting: Internal Medicine

## 2016-12-28 NOTE — Assessment & Plan Note (Signed)
Time for new labs; in the meantime we'll continue Pravachol 40 mg by mouth daily

## 2016-12-28 NOTE — Assessment & Plan Note (Signed)
Time for the level; plan to continue replacement thousand micrograms by mouth daily

## 2016-12-28 NOTE — Assessment & Plan Note (Signed)
Controlled; continue Cozaar 50 mg daily, Lopressor 5 mg twice a day and Norvasc 10 mg by mouth daily

## 2017-01-01 ENCOUNTER — Encounter: Payer: Self-pay | Admitting: Internal Medicine

## 2017-01-01 ENCOUNTER — Non-Acute Institutional Stay (SKILLED_NURSING_FACILITY): Payer: Medicare Other | Admitting: Internal Medicine

## 2017-01-01 DIAGNOSIS — R410 Disorientation, unspecified: Secondary | ICD-10-CM

## 2017-01-01 DIAGNOSIS — R109 Unspecified abdominal pain: Secondary | ICD-10-CM | POA: Diagnosis not present

## 2017-01-01 LAB — HEPATIC FUNCTION PANEL
ALK PHOS: 69 (ref 25–125)
ALT: 8 (ref 7–35)
AST: 11 — AB (ref 13–35)
BILIRUBIN, TOTAL: 0.2

## 2017-01-01 LAB — BASIC METABOLIC PANEL
BUN: 30 — AB (ref 4–21)
CREATININE: 0.7 (ref 0.5–1.1)
Glucose: 110
Potassium: 4.5 (ref 3.4–5.3)
Sodium: 135 — AB (ref 137–147)

## 2017-01-01 LAB — LIPID PANEL
Cholesterol: 108 (ref 0–200)
HDL: 46 (ref 35–70)
LDL CALC: 37
TRIGLYCERIDES: 129 (ref 40–160)

## 2017-01-01 LAB — HEMOGLOBIN A1C: HEMOGLOBIN A1C: 6.6

## 2017-01-01 LAB — CBC AND DIFFERENTIAL
HEMATOCRIT: 29 — AB (ref 36–46)
HEMOGLOBIN: 9.5 — AB (ref 12.0–16.0)
Platelets: 443 — AB (ref 150–399)
WBC: 16.2

## 2017-01-01 LAB — VITAMIN D 25 HYDROXY (VIT D DEFICIENCY, FRACTURES): Vit D, 25-Hydroxy: 34.29

## 2017-01-01 LAB — TSH: TSH: 1.96 (ref 0.41–5.90)

## 2017-01-01 NOTE — Progress Notes (Signed)
Location:  [place] Room Number: Independence of Service:  SNF (31)  Lauren Duos, MD  Patient Care Team: Lauren Duos, MD as PCP - General (Internal Medicine)  Extended Emergency Contact Information Primary Emergency Contact: Boesen,[name] Address: [address]          [place], [place] [address] [place] Phone: [phone] Relation: Son    Allergies: Patient has no known allergies.  Chief Complaint  Patient presents with  . Acute Visit    confused, change in mental status    HPI: Patient is 83 y.o. female who is being seen for a change in mental status. She has been confused for the last day. She also has complained of back pain which actually is pain in her right flank. Per nursing patient has had no fever nausea vomiting. Nothing makes it better nothing makes it worse.  Past Medical History:  Diagnosis Date  . Acute encephalopathy 08/20/2016  . B12 deficiency 08/20/2016  . Breast cancer ([place])   . Diabetes mellitus type 2, controlled, without complications ([place]) 9518  . Dysphagia 09/06/2016  . Effusion of right knee 08/18/2016  . Essential hypertension   . History of stroke with residual deficit 08/20/2016  . Hyperlipidemia   . Hypomagnesemia   . Leucocytosis   . Macular degeneration   . Osteoarthritis of right knee   . Physical debility   . Stroke [facility]) 2011   left sided weakness baseline  . Urinary incontinence in female   . Vascular dementia   . Vitamin B12 deficiency     Past Surgical History:  Procedure Laterality Date  . ABDOMINAL HYSTERECTOMY    . BREAST LUMPECTOMY    . CATARACT EXTRACTION      Allergies as of 01/01/2017   No Known Allergies     Medication List        Accurate as of 01/01/17 10:41 PM. Always use your most recent med list.          acetaminophen 500 MG tablet Commonly known as:  TYLENOL Take 500 mg by mouth. Take one tablet every 6 hours as needed for pain     amLODipine 10 MG tablet Commonly known as:  NORVASC Take 10 mg by mouth. Take one tablet daily for blood pressure   aspirin 325 MG tablet Take 325 mg by mouth. Take one tablet nightly   BIOFREEZE 4 % Gel Generic drug:  Menthol (Topical Analgesic) Apply topically. Apply one application topically to both knees 3 times daily as needed   CALCIUM 600-D PO Take by mouth. Take one daily   Cholecalciferol 1000 units tablet Take 1,000 Units by mouth. Take one Vitamin D capsule daily   clopidogrel 75 MG tablet Commonly known as:  PLAVIX Take 75 mg by mouth. Take one tablet daily   glipiZIDE 2.5 MG 24 hr tablet Commonly known as:  GLUCOTROL XL Take 2.5 mg by mouth. Take one tablet daily   GLUCERNA Liqd Take by mouth. Drink three times daily between meals due to weight loss and wound healing   Lidocaine 4 % Ptch Apply topically. Place one patch on the skin daily to right knee   losartan 50 MG tablet Commonly known as:  COZAAR Take 50 mg by mouth. Take one tablet daily   metFORMIN 1000 MG tablet Commonly known as:  GLUCOPHAGE Take 1,000 mg by mouth 2 (two) times daily with a meal.   metoprolol tartrate 50 MG tablet  Commonly known as:  LOPRESSOR Take 50 mg by mouth. Take one and a half tablet (75 mg)  twice daily for blood pressure   multivitamin tablet Take 1 tablet by mouth daily.   omeprazole 20 MG capsule Commonly known as:  PRILOSEC Take 20 mg by mouth. Take one tablet twice daily   ondansetron 4 MG tablet Commonly known as:  ZOFRAN Take 4 mg by mouth. Take one tablet every six hours as needed for nausea   oxybutynin 5 MG tablet Commonly known as:  DITROPAN Take 5 mg by mouth. Take one tablet 2 times daily   polyvinyl alcohol 1.4 % ophthalmic solution Commonly known as:  LIQUIFILM TEARS 1 drop. One drop to both eyes four times a day   pravastatin 40 MG tablet Commonly known as:  PRAVACHOL Take 40 mg by mouth. Take one tablet nightly   saccharomyces boulardii  250 MG capsule Commonly known as:  FLORASTOR Take 250 mg by mouth 2 (two) times daily.   traMADol 50 MG tablet Commonly known as:  ULTRAM Take by mouth. Take one tablet twice daily for pain   vitamin B-12 1000 MCG tablet Commonly known as:  CYANOCOBALAMIN Take 1,000 mcg by mouth. Take one Vitamin B-12 tablet daily       No orders of the defined types were placed in this encounter.   Immunization History  Administered Date(s) Administered  . Influenza-Unspecified 11/06/2015, 11/26/2016  . PPD Test 08/18/2016  . Pneumococcal Conjugate-13 12/14/2016  . Pneumococcal-Unspecified 09/06/2015  . Tdap 12/15/2016    Social History   Tobacco Use  . Smoking status: Never Smoker  . Smokeless tobacco: Never Used  Substance Use Topics  . Alcohol use: No    Review of Systems  DATA OBTAINED: from patient-limited; nursing-as per history of present illness GENERAL:  no fevers, fatigue, appetite changes SKIN: No itching, rash HEENT: No complaint RESPIRATORY: No cough, wheezing, SOB CARDIAC: No chest pain, palpitations, lower extremity edema  GI: No abdominal pain, No N/V/D or constipation, No heartburn or reflux  GU: No dysuria, frequency or urgency, or incontinence  MUSCULOSKELETAL: No unrelieved bone/joint pain NEUROLOGIC: No headache, dizziness  PSYCHIATRIC: No confusion  Vitals:   01/01/17 1103  BP: 120/60  Pulse: 78  Resp: 18  Temp: (!) 97 F (36.1 C)  SpO2: 97%   Body mass index is 21.03 kg/m. Physical Exam  GENERAL APPEARANCE: Alert,  No acute distress  SKIN: No diaphoresis rash HEENT: Unremarkable RESPIRATORY: Breathing is even, unlabored. Lung sounds are clear   CARDIOVASCULAR: Heart RRR no murmurs, rubs or gallops. No peripheral edema  GASTROINTESTINAL: Abdomen is soft, non-tender, not distended w/ normal bowel sounds.  GENITOURINARY: Bladder non tender, not distended; mild right flank tender to palpation  MUSCULOSKELETAL: No abnormal joints or  musculature NEUROLOGIC: Cranial nerves 2-12 grossly intact; left-sided weakness/paralysis PSYCHIATRIC: Mood and affect with dementia, no behavioral issues  Patient Active Problem List   Diagnosis Date Noted  . Sepsis ([place]) 10/28/2016  . Sacral decubitus ulcer, stage II 10/28/2016  . Acute urinary retention 10/28/2016  . GERD (gastroesophageal reflux disease) 10/28/2016  . Esophageal stricture 09/19/2016  . Vomiting and diarrhea 09/19/2016  . Bacterial overgrowth syndrome 09/19/2016  . Inflammatory polyarthritis ([place]) 09/19/2016  . Septic arthritis of knee, right ([place]) 09/19/2016  . UTI (urinary tract infection) 09/19/2016  . Dysphagia 09/06/2016  . Arthritis 08/20/2016  . Acute encephalopathy 08/20/2016  . B12 deficiency 08/20/2016  . Acute diarrhea 08/20/2016  . History of stroke with residual deficit  08/20/2016  . Hyperlipidemia 08/20/2016  . Vascular dementia 08/20/2016  . Essential hypertension 08/20/2016  . Yeast infection involving the vagina and surrounding area 08/20/2016  . Effusion of right knee 08/18/2016  . Diabetes mellitus type 2, controlled, without complications ([place]) 82/99/3716    CMP     Component Value Date/Time   NA 137 11/26/2016   K 4.5 11/26/2016   BUN 22 (A) 11/26/2016   CREATININE 0.6 11/26/2016   AST 15 11/26/2016   ALT 10 11/26/2016   ALKPHOS 76 11/26/2016   Recent Labs    08/20/16 10/11/16 11/26/16  NA 138 141 137  K 4.4 4.0 4.5  BUN 21 18 22*  CREATININE 0.7 0.6 0.6   Recent Labs    08/12/16 11/26/16  AST 15 15  ALT 9 10  ALKPHOS 64 76   Recent Labs    10/11/16 10/29/16 11/26/16  WBC 10.3 13.3 17.2  HGB 10.2* 8.9* 10.9*  HCT 32* 28* 34*  PLT 701* 522* 419*   Recent Labs    11/26/16  CHOL 164  LDLCALC 63  TRIG 222*   No results found for: [facility] Lab Results  Component Value Date   TSH 2.58 11/26/2016   Lab Results  Component Value Date   HGBA1C 5.7 11/26/2016   Lab Results  Component Value Date   CHOL 164  11/26/2016   HDL 57 11/26/2016   LDLCALC 63 11/26/2016   TRIG 222 (A) 11/26/2016    Significant Diagnostic Results in last 30 days:  No results found.  Assessment and Plan  CONFUSION/flank pain-some suspicious for UTI; patient has had no symptoms referable to her respiratory system; will obtain UA for C&S and for antibiotic sensitivities unless she patient decompensates    Webb Silversmith D. Sheppard Coil, MD

## 2017-01-04 ENCOUNTER — Encounter: Payer: Self-pay | Admitting: Internal Medicine

## 2017-01-04 ENCOUNTER — Other Ambulatory Visit: Payer: Self-pay

## 2017-01-06 ENCOUNTER — Emergency Department (HOSPITAL_BASED_OUTPATIENT_CLINIC_OR_DEPARTMENT_OTHER)
Admission: EM | Admit: 2017-01-06 | Discharge: 2017-01-06 | Disposition: A | Discharge status: Other Acute Inpatient Hospital | Payer: Medicare Other | Attending: Emergency Medicine | Admitting: Emergency Medicine

## 2017-01-06 ENCOUNTER — Emergency Department (HOSPITAL_BASED_OUTPATIENT_CLINIC_OR_DEPARTMENT_OTHER): Payer: Medicare Other

## 2017-01-06 ENCOUNTER — Other Ambulatory Visit: Payer: Self-pay

## 2017-01-06 ENCOUNTER — Encounter (HOSPITAL_BASED_OUTPATIENT_CLINIC_OR_DEPARTMENT_OTHER): Payer: Self-pay | Admitting: *Deleted

## 2017-01-06 DIAGNOSIS — K1121 Acute sialoadenitis: Secondary | ICD-10-CM

## 2017-01-06 DIAGNOSIS — Z79899 Other long term (current) drug therapy: Secondary | ICD-10-CM | POA: Insufficient documentation

## 2017-01-06 DIAGNOSIS — F0390 Unspecified dementia without behavioral disturbance: Secondary | ICD-10-CM | POA: Insufficient documentation

## 2017-01-06 DIAGNOSIS — Z853 Personal history of malignant neoplasm of breast: Secondary | ICD-10-CM | POA: Insufficient documentation

## 2017-01-06 DIAGNOSIS — E119 Type 2 diabetes mellitus without complications: Secondary | ICD-10-CM | POA: Diagnosis not present

## 2017-01-06 DIAGNOSIS — N390 Urinary tract infection, site not specified: Secondary | ICD-10-CM | POA: Diagnosis not present

## 2017-01-06 DIAGNOSIS — Y828 Other medical devices associated with adverse incidents: Secondary | ICD-10-CM | POA: Insufficient documentation

## 2017-01-06 DIAGNOSIS — Z7902 Long term (current) use of antithrombotics/antiplatelets: Secondary | ICD-10-CM | POA: Insufficient documentation

## 2017-01-06 DIAGNOSIS — R600 Localized edema: Secondary | ICD-10-CM | POA: Diagnosis present

## 2017-01-06 DIAGNOSIS — Z7984 Long term (current) use of oral hypoglycemic drugs: Secondary | ICD-10-CM | POA: Diagnosis not present

## 2017-01-06 DIAGNOSIS — T83511A Infection and inflammatory reaction due to indwelling urethral catheter, initial encounter: Secondary | ICD-10-CM | POA: Insufficient documentation

## 2017-01-06 DIAGNOSIS — A419 Sepsis, unspecified organism: Secondary | ICD-10-CM

## 2017-01-06 LAB — COMPREHENSIVE METABOLIC PANEL
ALBUMIN: 2.4 g/dL — AB (ref 3.5–5.0)
ALK PHOS: 65 U/L (ref 38–126)
ALT: 18 U/L (ref 14–54)
ANION GAP: 12 (ref 5–15)
AST: 28 U/L (ref 15–41)
BILIRUBIN TOTAL: 0.8 mg/dL (ref 0.3–1.2)
BUN: 19 mg/dL (ref 6–20)
CHLORIDE: 99 mmol/L — AB (ref 101–111)
CO2: 21 mmol/L — ABNORMAL LOW (ref 22–32)
Calcium: 8.7 mg/dL — ABNORMAL LOW (ref 8.9–10.3)
Creatinine, Ser: 0.61 mg/dL (ref 0.44–1.00)
GFR calc Af Amer: >60 mL/min (ref >60)
GFR calc non Af Amer: >60 mL/min (ref >60)
Glucose, Bld: 193 mg/dL — ABNORMAL HIGH (ref 65–99)
POTASSIUM: 4.7 mmol/L (ref 3.5–5.1)
Sodium: 132 mmol/L — ABNORMAL LOW (ref 135–145)
Total Protein: 7 g/dL (ref 6.5–8.1)

## 2017-01-06 LAB — CBC WITH DIFFERENTIAL/PLATELET
Basophils Absolute: 0 10*3/uL (ref 0.0–0.1)
Basophils Relative: 0 %
EOS PCT: 0 %
Eosinophils Absolute: 0 10*3/uL (ref 0.0–0.7)
HEMATOCRIT: 30.2 % — AB (ref 36.0–46.0)
Hemoglobin: 9.8 g/dL — ABNORMAL LOW (ref 12.0–15.0)
LYMPHS ABS: 2.2 10*3/uL (ref 0.7–4.0)
LYMPHS PCT: 5 %
MCH: 26.3 pg (ref 26.0–34.0)
MCHC: 32.5 g/dL (ref 30.0–36.0)
MCV: 81.2 fL (ref 78.0–100.0)
MONO ABS: 3.9 10*3/uL — AB (ref 0.1–1.0)
MONOS PCT: 10 %
Neutro Abs: 34.3 10*3/uL — ABNORMAL HIGH (ref 1.7–7.7)
Neutrophils Relative %: 85 %
Platelets: 635 10*3/uL — ABNORMAL HIGH (ref 150–400)
RBC: 3.72 MIL/uL — AB (ref 3.87–5.11)
RDW: 15 % (ref 11.5–15.5)
WBC: 40.4 10*3/uL — AB (ref 4.0–10.5)

## 2017-01-06 LAB — URINALYSIS, ROUTINE W REFLEX MICROSCOPIC
Bilirubin Urine: NEGATIVE
Glucose, UA: NEGATIVE mg/dL
Ketones, ur: 40 mg/dL — AB
Nitrite: POSITIVE — AB
PROTEIN: 100 mg/dL — AB
SPECIFIC GRAVITY, URINE: 1.02 (ref 1.005–1.030)
pH: 6 (ref 5.0–8.0)

## 2017-01-06 LAB — RESPIRATORY PANEL BY PCR
ADENOVIRUS-RVPPCR: NOT DETECTED
Bordetella pertussis: NOT DETECTED
CHLAMYDOPHILA PNEUMONIAE-RVPPCR: NOT DETECTED
CORONAVIRUS HKU1-RVPPCR: NOT DETECTED
CORONAVIRUS NL63-RVPPCR: NOT DETECTED
Coronavirus 229E: NOT DETECTED
Coronavirus OC43: NOT DETECTED
Influenza A: NOT DETECTED
Influenza B: NOT DETECTED
MYCOPLASMA PNEUMONIAE-RVPPCR: NOT DETECTED
Metapneumovirus: NOT DETECTED
PARAINFLUENZA VIRUS 3-RVPPCR: NOT DETECTED
Parainfluenza Virus 1: NOT DETECTED
Parainfluenza Virus 2: NOT DETECTED
Parainfluenza Virus 4: NOT DETECTED
RHINOVIRUS / ENTEROVIRUS - RVPPCR: NOT DETECTED
Respiratory Syncytial Virus: NOT DETECTED

## 2017-01-06 LAB — URINALYSIS, MICROSCOPIC (REFLEX)

## 2017-01-06 LAB — POTASSIUM: POTASSIUM: 3.4 mmol/L — AB (ref 3.5–5.1)

## 2017-01-06 LAB — I-STAT CG4 LACTIC ACID, ED: Lactic Acid, Venous: 1.48 mmol/L (ref 0.5–1.9)

## 2017-01-06 MED ORDER — IOPAMIDOL (ISOVUE-300) INJECTION 61%
100.0000 mL | Freq: Once | INTRAVENOUS | Status: AC | PRN
  Administered 2017-01-06: 75 mL via INTRAVENOUS

## 2017-01-06 MED ORDER — METOPROLOL TARTRATE 50 MG PO TABS
75.0000 mg | ORAL_TABLET | Freq: Once | ORAL | Status: AC
  Administered 2017-01-06: 75 mg via ORAL
  Filled 2017-01-06: qty 2

## 2017-01-06 MED ORDER — ACETAMINOPHEN 650 MG RE SUPP
650.0000 mg | Freq: Once | RECTAL | Status: AC
  Administered 2017-01-06: 650 mg via RECTAL

## 2017-01-06 MED ORDER — NYSTATIN 100000 UNIT/GM EX POWD
Freq: Two times a day (BID) | CUTANEOUS | Status: DC
  Administered 2017-01-06: 10:00:00 via TOPICAL
  Filled 2017-01-06: qty 15

## 2017-01-06 MED ORDER — SODIUM CHLORIDE 0.9 % IV SOLN
1.0000 g | Freq: Once | INTRAVENOUS | Status: AC
  Administered 2017-01-06: 0.5 g via INTRAVENOUS
  Filled 2017-01-06: qty 1

## 2017-01-06 MED ORDER — SODIUM CHLORIDE 0.9 % IV BOLUS (SEPSIS)
1000.0000 mL | INTRAVENOUS | Status: AC
  Administered 2017-01-06: 1000 mL via INTRAVENOUS

## 2017-01-06 MED ORDER — ACETAMINOPHEN 650 MG RE SUPP
RECTAL | Status: AC
  Filled 2017-01-06: qty 1

## 2017-01-06 MED ORDER — HYDROCODONE-ACETAMINOPHEN 5-325 MG PO TABS
1.0000 | ORAL_TABLET | Freq: Once | ORAL | Status: AC
  Administered 2017-01-06: 1 via ORAL
  Filled 2017-01-06: qty 1

## 2017-01-06 NOTE — ED Triage Notes (Signed)
Brought in by EMS from [facility]. Facility reports swollen lymph nodes.

## 2017-01-06 NOTE — ED Provider Notes (Signed)
  Physical Exam  BP (!) 118/47   Pulse (!) 121   Temp (!) 100.4 F (38 C) (Oral)   Resp 15   Wt 52.2 kg (115 lb)   SpO2 95%   BMI 21.03 kg/m   Physical Exam  ED Course  Procedures  MDM Patient now ready for transfer.       Davonna Belling, MD 01/06/17 [phone]

## 2017-01-06 NOTE — ED Notes (Signed)
ED Provider at bedside. 

## 2017-01-06 NOTE — ED Notes (Signed)
Patient transported to X-ray 

## 2017-01-06 NOTE — ED Notes (Addendum)
Pt's Son in room, insists on pt getting admitted to [facility], even if no beds, would like ED to ED transfer. He is a Marine scientist at same. EDP made aware.  Pt on cardiac monitor and auto VS.

## 2017-01-06 NOTE — ED Provider Notes (Signed)
[place] EMERGENCY DEPARTMENT Provider Note   CSN: [id number] Arrival date & time: 01/06/17  0703     History   Chief Complaint Chief Complaint  Patient presents with  . Weakness    HPI Lauren Dennis is a 83 y.o. female.   Level 5 caveat due to dementia/AMS  83 year old female presenting from [place] from nursing home via EMS after family reportedly noticed swelling on her bilateral face.  It is believed that the swelling developed in the last day.  Per chart review patient was seen by Dr. Inocencio Homes [facility] senior care) on 01/01/2017 for reported altered mental status and was was presumed to have urinary tract infection.  At that time she obtained a UA and culture and sensitivities.  Unfortunately these results are unavailable.  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  Urine is clear and yellow.  Unclear when Foley catheter was last replaced.  Patient reports diffuse pain, but is unable to report specifics.       Past Medical History:  Diagnosis Date  . Acute encephalopathy 08/20/2016  . B12 deficiency 08/20/2016  . Breast cancer ([place])   . Diabetes mellitus type 2, controlled, without complications ([place]) 4765  . Dysphagia 09/06/2016  . Effusion of right knee 08/18/2016  . Essential hypertension   . History of stroke with residual deficit 08/20/2016  . Hyperlipidemia   . Hypomagnesemia   . Leucocytosis   . Macular degeneration   . Osteoarthritis of right knee   . Physical debility   . Stroke [facility]) 2011   left sided weakness baseline  . Urinary incontinence in female   . Vascular dementia   . Vitamin B12 deficiency     Patient Active Problem List   Diagnosis Date Noted  . Sepsis ([place]) 10/28/2016  . Sacral decubitus ulcer, stage II 10/28/2016  . Acute urinary retention 10/28/2016  . GERD (gastroesophageal reflux disease) 10/28/2016  . Esophageal stricture  09/19/2016  . Vomiting and diarrhea 09/19/2016  . Bacterial overgrowth syndrome 09/19/2016  . Inflammatory polyarthritis ([place]) 09/19/2016  . Septic arthritis of knee, right ([place]) 09/19/2016  . UTI (urinary tract infection) 09/19/2016  . Dysphagia 09/06/2016  . Arthritis 08/20/2016  . Acute encephalopathy 08/20/2016  . B12 deficiency 08/20/2016  . Acute diarrhea 08/20/2016  . History of stroke with residual deficit 08/20/2016  . Hyperlipidemia 08/20/2016  . Vascular dementia 08/20/2016  . Essential hypertension 08/20/2016  . Yeast infection involving the vagina and surrounding area 08/20/2016  . Effusion of right knee 08/18/2016  . Diabetes mellitus type 2, controlled, without complications ([place]) 46/50/3546    Past Surgical History:  Procedure Laterality Date  . ABDOMINAL HYSTERECTOMY    . BREAST LUMPECTOMY    . CATARACT EXTRACTION      OB History    No data available       Home Medications    Prior to Admission medications   Medication Sig Start Date End Date Taking? Authorizing Provider  acetaminophen (TYLENOL) 500 MG tablet Take 500 mg by mouth. Take one tablet every 6 hours as needed for pain    [provider]  amLODipine (NORVASC) 10 MG tablet Take 10 mg by mouth. Take one tablet daily for blood pressure    [provider]  aspirin 325 MG tablet Take 325 mg by mouth. Take one tablet nightly    [provider]  Calcium Carb-Cholecalciferol (CALCIUM 600-D PO) Take by  mouth. Take one daily    [provider]  Cholecalciferol 1000 units tablet Take 1,000 Units by mouth. Take one Vitamin D capsule daily    [provider]  clopidogrel (PLAVIX) 75 MG tablet Take 75 mg by mouth. Take one tablet daily    [provider]  glipiZIDE (GLUCOTROL XL) 2.5 MG 24 hr tablet Take 2.5 mg by mouth. Take one tablet daily    [provider]  [facility] [facility]) LIQD Take by mouth. Drink three times daily between meals due to  weight loss and wound healing    [provider]  Lidocaine 4 % PTCH Apply topically. Place one patch on the skin daily to right knee    [provider]  losartan (COZAAR) 50 MG tablet Take 50 mg by mouth. Take one tablet daily    [provider]  Menthol, Topical Analgesic, (BIOFREEZE) 4 % GEL Apply topically. Apply one application topically to both knees 3 times daily as needed    [provider]  metFORMIN (GLUCOPHAGE) 1000 MG tablet Take 1,000 mg by mouth 2 (two) times daily with a meal.    [provider]  metoprolol tartrate (LOPRESSOR) 50 MG tablet Take 50 mg by mouth. Take one and a half tablet (75 mg)  twice daily for blood pressure    [provider]  Multiple Vitamin (MULTIVITAMIN) tablet Take 1 tablet by mouth daily.    [provider]  omeprazole (PRILOSEC) 20 MG capsule Take 20 mg by mouth. Take one tablet twice daily    [provider]  ondansetron (ZOFRAN) 4 MG tablet Take 4 mg by mouth. Take one tablet every six hours as needed for nausea    [provider]  oxybutynin (DITROPAN) 5 MG tablet Take 5 mg by mouth. Take one tablet 2 times daily    [provider]  polyvinyl alcohol (LIQUIFILM TEARS) 1.4 % ophthalmic solution 1 drop. One drop to both eyes four times a day    [provider]  pravastatin (PRAVACHOL) 40 MG tablet Take 40 mg by mouth. Take one tablet nightly    [provider]  saccharomyces boulardii (FLORASTOR) 250 MG capsule Take 250 mg by mouth 2 (two) times daily.    [provider]  traMADol (ULTRAM) 50 MG tablet Take by mouth. Take one tablet twice daily for pain    [provider]  vitamin B-12 (CYANOCOBALAMIN) 1000 MCG tablet Take 1,000 mcg by mouth. Take one Vitamin B-12 tablet daily 08/19/16   [provider]    Family History Family History  Problem Relation Age of Onset  . Hypertension Mother   . Macular degeneration Father     . Stroke Father   . Macular degeneration Sister     Social History Social History   Tobacco Use  . Smoking status: Never Smoker  . Smokeless tobacco: Never Used  Substance Use Topics  . Alcohol use: No  . Drug use: No     Allergies   Patient has no known allergies.   Review of Systems Review of Systems  Unable to perform ROS: Dementia   Level 5 caveat due to AMS  Physical Exam Updated Vital Signs BP (!) 139/53   Pulse (!) 104   Temp 99.6 F (37.6 C) (Rectal)   Resp 15   Wt 52.2 kg (115 lb)   SpO2 98%   BMI 21.03 kg/m   Physical Exam  Constitutional:  Chronically ill appearing 83yo F lying in  bed appearing uncomfortable, but in NAD  HENT:  Head: Normocephalic and atraumatic.  Eyes: Conjunctivae are normal. Pupils are equal, round, and reactive to light.  Dry mucus membranes, poor dentition  Neck: No JVD present.  Left eye with yellow crust  Cardiovascular: Normal rate, regular rhythm and normal heart sounds. Exam reveals no gallop and no friction rub.  No murmur heard. Significant swelling along jaw-line and lateral neck. TTP without warmth, drainage or erythema  Pulmonary/Chest: Effort normal. No respiratory distress. She has rales.  Abdominal: Soft. She exhibits no distension. There is no tenderness. There is no rebound and no guarding.  Skin:  2x2cm stage one decubitus ulcer on medial aspect of left lower leg and on calcaneous at the insertion of achilles tendon     ED Treatments / Results  Labs (all labs ordered are listed, but only abnormal results are displayed) Labs Reviewed  URINALYSIS, ROUTINE W REFLEX MICROSCOPIC - Abnormal; Notable for the following components:      Result Value   APPearance CLOUDY (*)    Hgb urine dipstick SMALL (*)    Ketones, ur 40 (*)    Protein, ur 100 (*)    Nitrite POSITIVE (*)    Leukocytes, UA MODERATE (*)    All other components within normal limits  CBC WITH DIFFERENTIAL/PLATELET - Abnormal; Notable for the  following components:   WBC 40.4 (*)    RBC 3.72 (*)    Hemoglobin 9.8 (*)    HCT 30.2 (*)    Platelets 635 (*)    Neutro Abs 34.3 (*)    Monocytes Absolute 3.9 (*)    All other components within normal limits  URINALYSIS, MICROSCOPIC (REFLEX) - Abnormal; Notable for the following components:   Bacteria, UA MANY (*)    Squamous Epithelial / LPF 0-5 (*)    All other components within normal limits  COMPREHENSIVE METABOLIC PANEL - Abnormal; Notable for the following components:   Sodium 132 (*)    Chloride 99 (*)    CO2 21 (*)    Glucose, Bld 193 (*)    Calcium 8.7 (*)    Albumin 2.4 (*)    All other components within normal limits  POTASSIUM - Abnormal; Notable for the following components:   Potassium 3.4 (*)    All other components within normal limits  URINE CULTURE  CULTURE, BLOOD (ROUTINE X 2)  CULTURE, BLOOD (ROUTINE X 2)  RESPIRATORY PANEL BY PCR  EPSTEIN-BARR VIRUS VCA, IGG  EPSTEIN-BARR VIRUS VCA, IGM  RSV(RESPIRATORY SYNCYTIAL VIRUS) AB, BLOOD  CMV IGM  I-STAT CG4 LACTIC ACID, ED  I-STAT CG4 LACTIC ACID, ED    EKG  EKG Interpretation  Date/Time:  Sunday January 06 2017 07:33:50 EST Ventricular Rate:  93 PR Interval:    QRS Duration: 79 QT Interval:  350 QTC Calculation: 436 R Axis:   9 Text Interpretation:  Sinus rhythm Atrial premature complexes Anteroseptal infarct, age indeterminate No previous ECGs available Confirmed by Theotis Burrow [id number]) on 01/06/2017 8:16:58 AM       Radiology Dg Chest 2 View  Result Date: 01/06/2017 CLINICAL DATA:  Weakness EXAM: CHEST  2 VIEW COMPARISON:  None. FINDINGS: Heart and mediastinal contours are within normal limits. No focal opacities or effusions. No acute bony abnormality. IMPRESSION: No active cardiopulmonary disease. Electronically Signed   By: Rolm Baptise M.D.   On: 01/06/2017 07:56   Ct Soft Tissue Neck W Contrast  Result Date: 01/06/2017 CLINICAL DATA:  Nursing home patient with facial  swelling.  Elevated white count. EXAM: CT NECK WITH CONTRAST TECHNIQUE: Multidetector CT imaging of the neck was performed using the standard protocol following the bolus administration of intravenous contrast. CONTRAST:  34mL ISOVUE-300 IOPAMIDOL (ISOVUE-300) INJECTION 61% COMPARISON:  None. FINDINGS: Pharynx and larynx: There is significant obscuration of much of the oropharynx and hypopharynx due to artifact from dental amalgam. No mucosal lesion or pharyngeal mass. There is an effusion in the retropharyngeal space, but no enhancement to suggest retropharyngeal abscess. Mildly edematous epiglottis. Normal larynx. Salivary glands: There is marked enlargement, edema, and enhancement of both parotid glands, greater on the LEFT, with inflammatory change and fluid tracking downward into the neck. Given the elevated white count, infectious bacterial or viral parotitis is suggested. No evidence for parotid abscess. No definite involvement of the submandibular glands. Thyroid: Normal. Lymph nodes: There may be reactive level 2 lymph nodes, but no pathologic adenopathy is seen. Vascular: Carotid bifurcation atherosclerosis. No definite vascular occlusion or septic thrombophlebitis. Calcification of the cavernous internal carotid arteries consistent with cerebrovascular atherosclerotic disease. Limited intracranial: Mild atrophy, not unexpected for age. Visualized orbits: BILATERAL cataract extraction. Mastoids and visualized paranasal sinuses: No mastoid fluid. Skeleton: Advanced spondylosis.  No worrisome osseous features. Upper chest: No pneumothorax or consolidation. Other: None. IMPRESSION: Marked enlargement, edema, and enhancement of both parotid glands, greater on the LEFT, consistent with infectious parotitis this could be bacterial or viral. No evidence for parotid abscess. No pathologic lymphadenopathy. Significant artifact from patient's dental work, obscures much of the pharynx. There is a small retropharyngeal effusion,  but no evidence for tonsillar or pharyngeal mass. Electronically Signed   By: Staci Righter M.D.   On: 01/06/2017 10:12    Procedures Procedures (including critical care time)  Medications Ordered in ED Medications  acetaminophen (TYLENOL) 650 MG suppository (not administered)  nystatin (MYCOSTATIN/NYSTOP) topical powder ( Topical Given 01/06/17 1004)  acetaminophen (TYLENOL) suppository 650 mg (650 mg Rectal Given 01/06/17 0726)  meropenem (MERREM) 1 g in sodium chloride 0.9 % 100 mL IVPB (0 g Intravenous Stopped 01/06/17 0919)  sodium chloride 0.9 % bolus 1,000 mL (0 mLs Intravenous Stopped 01/06/17 0855)  iopamidol (ISOVUE-300) 61 % injection 100 mL (75 mLs Intravenous Contrast Given 01/06/17 0946)  HYDROcodone-acetaminophen (NORCO/VICODIN) 5-325 MG per tablet 1 tablet (1 tablet Oral Given 01/06/17 1129)     Initial Impression / Assessment and Plan / ED Course  I have reviewed the triage vital signs and the nursing notes.  Pertinent labs & imaging results that were available during my care of the patient were reviewed by me and considered in my medical decision making (see chart for details).     Narcissa Melder is an 83 year old female who is chronically ill presenting from nursing home due to with bilateral swelling in the area of her parotid glands found to be septic with fever to 101.2, leukocytosis >40 and UA positive for nitrites and mod leukocytes. Given history of MDI, UTI patient was started on meropenem. CT neck with contrast showed marked enlargement and enhancement of bilateral parotid glands left>great concerning for infectious parotitis. Given excellent anaerobic and GN coverage with meropenem, no additional coverage was necessary. Due to septic presentation and general deconditioned state and need for IV abx, patient will require admission for stabilization. Patient was transferred to [facility] per son's request.   Final Clinical Impressions(s) / ED Diagnoses   Final  diagnoses:  Sepsis, due to unspecified organism [facility])  Urinary tract infection associated with indwelling urethral catheter, initial  encounter [facility])  Acute parotitis    ED Discharge Orders    None     Daniel L. Rosalyn Gess, [place] Medicine Resident PGY-2 01/06/2017 1:44 PM     Eloise Levels, MD 01/06/17 [place], Wenda [place], MD 01/06/17 2147

## 2017-01-06 NOTE — ED Notes (Signed)
Stage 2 pressure ulcer to sacral area.  Foley cath intact.  Bilateral mandibular area is swollen.

## 2017-01-07 ENCOUNTER — Telehealth: Payer: Self-pay

## 2017-01-07 ENCOUNTER — Telehealth (HOSPITAL_BASED_OUTPATIENT_CLINIC_OR_DEPARTMENT_OTHER): Payer: Self-pay | Admitting: Emergency Medicine

## 2017-01-07 LAB — BLOOD CULTURE ID PANEL (REFLEXED)
ACINETOBACTER BAUMANNII: NOT DETECTED
CANDIDA PARAPSILOSIS: NOT DETECTED
Candida albicans: NOT DETECTED
Candida glabrata: NOT DETECTED
Candida krusei: NOT DETECTED
Candida tropicalis: NOT DETECTED
ENTEROCOCCUS SPECIES: NOT DETECTED
Enterobacter cloacae complex: NOT DETECTED
Enterobacteriaceae species: NOT DETECTED
Escherichia coli: NOT DETECTED
HAEMOPHILUS INFLUENZAE: NOT DETECTED
KLEBSIELLA OXYTOCA: NOT DETECTED
Klebsiella pneumoniae: NOT DETECTED
LISTERIA MONOCYTOGENES: NOT DETECTED
METHICILLIN RESISTANCE: DETECTED — AB
NEISSERIA MENINGITIDIS: NOT DETECTED
PSEUDOMONAS AERUGINOSA: NOT DETECTED
Proteus species: NOT DETECTED
SERRATIA MARCESCENS: NOT DETECTED
STAPHYLOCOCCUS AUREUS BCID: DETECTED — AB
STREPTOCOCCUS PNEUMONIAE: NOT DETECTED
Staphylococcus species: DETECTED — AB
Streptococcus agalactiae: NOT DETECTED
Streptococcus pyogenes: NOT DETECTED
Streptococcus species: NOT DETECTED

## 2017-01-07 LAB — CMV IGM: CMV IgM: <30 AU/mL (ref 0.0–29.9)

## 2017-01-07 LAB — EPSTEIN-BARR VIRUS VCA, IGM: EBV VCA IgM: <36 U/mL (ref 0.0–35.9)

## 2017-01-07 LAB — EPSTEIN-BARR VIRUS VCA, IGG: EBV VCA IgG: >600 U/mL — ABNORMAL HIGH (ref 0.0–17.9)

## 2017-01-07 NOTE — Telephone Encounter (Signed)
Possible re-admission to facility. This is a patient you were seeing at [facility] . [facility] F/U is needed if patient was re-admitted to facility upon discharge. Hospital discharge from [facility] ED on 01/06/2017

## 2017-01-08 LAB — URINE CULTURE

## 2017-01-08 LAB — RSV(RESPIRATORY SYNCYTIAL VIRUS) AB, BLOOD

## 2017-01-11 LAB — CULTURE, BLOOD (ROUTINE X 2)
SPECIAL REQUESTS: ADEQUATE
Special Requests: ADEQUATE

## 2017-01-13 NOTE — Telephone Encounter (Signed)
BC report faxed to [facility] reg [phone] Pt in [place]

## 2017-01-17 ENCOUNTER — Encounter: Payer: Self-pay | Admitting: Internal Medicine

## 2017-01-17 ENCOUNTER — Non-Acute Institutional Stay (SKILLED_NURSING_FACILITY): Payer: Medicare Other | Admitting: Internal Medicine

## 2017-01-17 DIAGNOSIS — B9620 Unspecified Escherichia coli [E. coli] as the cause of diseases classified elsewhere: Secondary | ICD-10-CM

## 2017-01-17 DIAGNOSIS — E785 Hyperlipidemia, unspecified: Secondary | ICD-10-CM | POA: Diagnosis not present

## 2017-01-17 DIAGNOSIS — L89899 Pressure ulcer of other site, unspecified stage: Secondary | ICD-10-CM | POA: Diagnosis not present

## 2017-01-17 DIAGNOSIS — A4102 Sepsis due to Methicillin resistant Staphylococcus aureus: Secondary | ICD-10-CM

## 2017-01-17 DIAGNOSIS — I6930 Unspecified sequelae of cerebral infarction: Secondary | ICD-10-CM | POA: Diagnosis not present

## 2017-01-17 DIAGNOSIS — K1121 Acute sialoadenitis: Secondary | ICD-10-CM

## 2017-01-17 DIAGNOSIS — L89152 Pressure ulcer of sacral region, stage 2: Secondary | ICD-10-CM

## 2017-01-17 DIAGNOSIS — I10 Essential (primary) hypertension: Secondary | ICD-10-CM

## 2017-01-17 DIAGNOSIS — L89629 Pressure ulcer of left heel, unspecified stage: Secondary | ICD-10-CM | POA: Diagnosis not present

## 2017-01-17 DIAGNOSIS — N390 Urinary tract infection, site not specified: Secondary | ICD-10-CM | POA: Diagnosis not present

## 2017-01-17 DIAGNOSIS — R1310 Dysphagia, unspecified: Secondary | ICD-10-CM | POA: Diagnosis not present

## 2017-01-17 DIAGNOSIS — T83511S Infection and inflammatory reaction due to indwelling urethral catheter, sequela: Secondary | ICD-10-CM

## 2017-01-17 DIAGNOSIS — E119 Type 2 diabetes mellitus without complications: Secondary | ICD-10-CM | POA: Diagnosis not present

## 2017-01-17 DIAGNOSIS — R1319 Other dysphagia: Secondary | ICD-10-CM

## 2017-01-17 NOTE — Progress Notes (Signed)
: Provider:  Noah Delaine. Sheppard Coil, MD Location:  [facility] Room Number: 205D Place of Service:  SNF [phone])  PCP: Hennie Duos, MD Patient Care Team: Hennie Duos, MD as PCP - General (Internal Medicine)  Extended Emergency Contact Information Primary Emergency Contact: Labonte,[name] Address: [address]          [place], [place] [address] [name] of [place] Phone: [phone] Relation: Son Secondary Emergency Contact: [facility] Phone: [phone] Relation: Other     Allergies: Patient has no known allergies.  Chief Complaint  Patient presents with  . Readmit To SNF    following hospitalization  01/06/17 to 01/16/17 sepsis secondary to MRSA    HPI: Patient is 83 y.o. female with hypertension, high cholesterol, diabetes, status post stroke, also found through care everywhere ,for whom I have no H&P to refer to and who was admitted to [facility] from 12/2-12, meaning the discharge summary is very sparse, was treated for sepsis secondary to MRSA, Escherichia coli UTI, bilateral parotitis during her hospital stay. Patient is admitted to skilled nursing facility with generalized weakness for OT/PT and for continue vancomycin IV. While at skilled nursing facility patient will be followed for hypertension treated with Norvasc and Cozaar and metoprolol, diabetes mellitus 2 treated with metformin and glipizide, and hyperlipidemia treated with Pravachol.  Past Medical History:  Diagnosis Date  . Acute encephalopathy 08/20/2016  . B12 deficiency 08/20/2016  . Breast cancer ([place])   . Diabetes mellitus type 2, controlled, without complications ([place]) 5102  . Dysphagia 09/06/2016  . Effusion of right knee 08/18/2016  . Essential hypertension   . History of stroke with residual deficit 08/20/2016  . Hyperlipidemia   . Hypomagnesemia   . Inflammatory polyarthritis ([place]) 09/19/2016  . Leucocytosis   . Macular  degeneration   . Osteoarthritis of right knee   . Physical debility   . Sacral decubitus ulcer, stage II 10/28/2016  . Sepsis ([place]) 10/28/2016  . Stroke [facility]) 2011   left sided weakness baseline  . Urinary incontinence in female   . UTI (urinary tract infection) 09/19/2016  . Vascular dementia   . Vitamin B12 deficiency     Past Surgical History:  Procedure Laterality Date  . ABDOMINAL HYSTERECTOMY    . BREAST LUMPECTOMY    . CATARACT EXTRACTION      Allergies as of 01/17/2017   No Known Allergies     Medication List        Accurate as of 01/17/17 10:36 AM. Always use your most recent med list.          acetaminophen 500 MG tablet Commonly known as:  TYLENOL Take 500 mg by mouth. Take one tablet every 6 hours as needed for pain   amLODipine 10 MG tablet Commonly known as:  NORVASC Take 10 mg by mouth. Take one tablet daily for blood pressure   aspirin 325 MG tablet Take 325 mg by mouth. Take one tablet nightly   BIOFREEZE 4 % Gel Generic drug:  Menthol (Topical Analgesic) Apply topically. Apply one application topically to both knees 3 times daily as needed   CALCIUM 600-D PO Take by mouth. Take one daily   Cholecalciferol 1000 units tablet Take 1,000 Units by mouth. Take one Vitamin D capsule daily   clopidogrel 75 MG tablet Commonly known as:  PLAVIX Take 75 mg by mouth. Take one tablet daily   glipiZIDE 2.5 MG 24 hr tablet Commonly  known as:  GLUCOTROL XL Take 2.5 mg by mouth. Take one tablet daily   Lidocaine 4 % Ptch Apply topically. Place one patch on the skin daily to right knee   losartan 50 MG tablet Commonly known as:  COZAAR Take 50 mg by mouth. Take one tablet daily   metFORMIN 1000 MG tablet Commonly known as:  GLUCOPHAGE Take 1,000 mg by mouth 2 (two) times daily with a meal.   metoprolol tartrate 50 MG tablet Commonly known as:  LOPRESSOR Take 50 mg by mouth. Take one and a half tablet (75 mg)  twice daily for blood pressure     multivitamin tablet Take 1 tablet by mouth daily.   omeprazole 20 MG capsule Commonly known as:  PRILOSEC Take 20 mg by mouth. Take one tablet twice daily   ondansetron 4 MG tablet Commonly known as:  ZOFRAN Take 4 mg by mouth. Take one tablet every six hours as needed for nausea   polyvinyl alcohol 1.4 % ophthalmic solution Commonly known as:  LIQUIFILM TEARS 1 drop. One drop to both eyes four times a day   pravastatin 40 MG tablet Commonly known as:  PRAVACHOL Take 40 mg by mouth. Take one tablet nightly   saccharomyces boulardii 250 MG capsule Commonly known as:  FLORASTOR Take 250 mg by mouth 2 (two) times daily.   traMADol 50 MG tablet Commonly known as:  ULTRAM Take by mouth. Take one tablet every 8 hours as needed for pain stop 01/30/17   vancomycin 500 mg in sodium chloride 0.9 % 100 mL Inject 500 mg into the vein. Infuse into vein via PICC line to right upper arm every 12 hour for 19 days. Stop 02/04/17       No orders of the defined types were placed in this encounter.   Immunization History  Administered Date(s) Administered  . Influenza-Unspecified 11/06/2015, 11/26/2016  . PPD Test 08/18/2016  . Pneumococcal Conjugate-13 12/14/2016  . Pneumococcal-Unspecified 09/06/2015  . Tdap 12/15/2016    Social History   Tobacco Use  . Smoking status: Never Smoker  . Smokeless tobacco: Never Used  Substance Use Topics  . Alcohol use: No    Family history is   Family History  Problem Relation Age of Onset  . Hypertension Mother   . Macular degeneration Father   . Stroke Father   . Macular degeneration Sister       Review of Systems  DATA OBTAINED: from patient, nurse GENERAL:  no fevers, fatigue, appetite changes SKIN: No itching, or rash EYES: No eye pain, redness, discharge EARS: No earache, tinnitus, change in hearing NOSE: No congestion, drainage or bleeding  MOUTH/THROAT: No mouth or tooth pain, No sore throat RESPIRATORY: No cough,  wheezing, SOB CARDIAC: No chest pain, palpitations, lower extremity edema  GI: No abdominal pain, No N/V/D or constipation, No heartburn or reflux  GU: No dysuria, frequency or urgency, or incontinence  MUSCULOSKELETAL: No unrelieved bone/joint pain NEUROLOGIC: No headache, dizziness or focal weakness PSYCHIATRIC: No c/o anxiety or sadness   Vitals:   01/17/17 1018  BP: 130/69  Pulse: 66  Resp: 18  Temp: 98 F (36.7 C)    SpO2 Readings from Last 1 Encounters:  01/06/17 95%   Body mass index is 20.85 kg/m.     Physical Exam  GENERAL APPEARANCE: Alert, conversant,  No acute distress.  SKIN: No diaphoresis rash HEAD: Normocephalic, atraumatic  EYES: Conjunctiva/lids clear. Pupils round, reactive. EOMs intact.  EARS: External exam WNL, canals clear.  Hearing grossly normal.  NOSE: No deformity or discharge.  MOUTH/THROAT: Lips w/o lesions  RESPIRATORY: Breathing is even, unlabored. Lung sounds are clear   CARDIOVASCULAR: Heart RRR no murmurs, rubs or gallops. No peripheral edema.   GASTROINTESTINAL: Abdomen is soft, non-tender, not distended w/ normal bowel sounds. GENITOURINARY: Bladder non tender, not distended  MUSCULOSKELETAL: No abnormal joints or musculature NEUROLOGIC:  Cranial nerves 2-12 grossly intact. Moves all extremities  PSYCHIATRIC: Mood and affect appropriate to situation, no behavioral issues  Patient Active Problem List   Diagnosis Date Noted  . Sepsis ([place]) 10/28/2016  . Sacral decubitus ulcer, stage II 10/28/2016  . Acute urinary retention 10/28/2016  . GERD (gastroesophageal reflux disease) 10/28/2016  . Esophageal stricture 09/19/2016  . Vomiting and diarrhea 09/19/2016  . Bacterial overgrowth syndrome 09/19/2016  . Inflammatory polyarthritis ([place]) 09/19/2016  . Septic arthritis of knee, right ([place]) 09/19/2016  . UTI (urinary tract infection) 09/19/2016  . Dysphagia 09/06/2016  . Arthritis 08/20/2016  . Acute encephalopathy 08/20/2016  . B12  deficiency 08/20/2016  . Acute diarrhea 08/20/2016  . History of stroke with residual deficit 08/20/2016  . Hyperlipidemia 08/20/2016  . Vascular dementia 08/20/2016  . Essential hypertension 08/20/2016  . Yeast infection involving the vagina and surrounding area 08/20/2016  . Effusion of right knee 08/18/2016  . Diabetes mellitus type 2, controlled, without complications ([place]) 16/11/9602      Labs reviewed: Basic Metabolic Panel:    Component Value Date/Time   NA 132 (L) 01/06/2017 0758   NA 135 (A) 01/01/2017   K 3.4 (L) 01/06/2017 0914   CL 99 (L) 01/06/2017 0758   CO2 21 (L) 01/06/2017 0758   GLUCOSE 193 (H) 01/06/2017 0758   BUN 19 01/06/2017 0758   BUN 30 (A) 01/01/2017   CREATININE 0.61 01/06/2017 0758   CALCIUM 8.7 (L) 01/06/2017 0758   PROT 7.0 01/06/2017 0758   ALBUMIN 2.4 (L) 01/06/2017 0758   AST 28 01/06/2017 0758   ALT 18 01/06/2017 0758   ALKPHOS 65 01/06/2017 0758   BILITOT 0.8 01/06/2017 0758   GFRNONAA >60 01/06/2017 0758   GFRAA >60 01/06/2017 0758    Recent Labs    11/26/16 01/01/17 01/06/17 0758 01/06/17 0914  NA 137 135* 132*  --   K 4.5 4.5 4.7 3.4*  CL  --   --  99*  --   CO2  --   --  21*  --   GLUCOSE  --   --  193*  --   BUN 22* 30* 19  --   CREATININE 0.6 0.7 0.61  --   CALCIUM  --   --  8.7*  --    Liver Function Tests: Recent Labs    11/26/16 01/01/17 01/06/17 0758  AST 15 11* 28  ALT 10 8 18   ALKPHOS 76 69 65  BILITOT  --   --  0.8  PROT  --   --  7.0  ALBUMIN  --   --  2.4*   No results for input(s): LIPASE, AMYLASE in the last 8760 hours. No results for input(s): AMMONIA in the last 8760 hours. CBC: Recent Labs    11/26/16 01/01/17 01/06/17 0710  WBC 17.2 16.2 40.4*  NEUTROABS  --   --  34.3*  HGB 10.9* 9.5* 9.8*  HCT 34* 29* 30.2*  MCV  --   --  81.2  PLT 419* 443* 635*   Lipid Recent Labs    11/26/16 01/01/17  CHOL 164 108  HDL 57 46  LDLCALC 63 37  TRIG 222* 129    Cardiac Enzymes: No results for  input(s): CKTOTAL, CKMB, CKMBINDEX, TROPONINI in the last 8760 hours. BNP: No results for input(s): BNP in the last 8760 hours. No results found for: [facility] Lab Results  Component Value Date   HGBA1C 6.6 01/01/2017   Lab Results  Component Value Date   TSH 1.96 01/01/2017   Lab Results  Component Value Date   VITAMINB12 1,055 11/26/2016   No results found for: FOLATE No results found for: IRON, TIBC, FERRITIN  Imaging and Procedures obtained prior to SNF admission: Dg Chest 2 View  Result Date: 01/06/2017 CLINICAL DATA:  Weakness EXAM: CHEST  2 VIEW COMPARISON:  None. FINDINGS: Heart and mediastinal contours are within normal limits. No focal opacities or effusions. No acute bony abnormality. IMPRESSION: No active cardiopulmonary disease. Electronically Signed   By: Rolm Baptise M.D.   On: 01/06/2017 07:56   Ct Soft Tissue Neck W Contrast  Result Date: 01/06/2017 CLINICAL DATA:  Nursing home patient with facial swelling. Elevated white count. EXAM: CT NECK WITH CONTRAST TECHNIQUE: Multidetector CT imaging of the neck was performed using the standard protocol following the bolus administration of intravenous contrast. CONTRAST:  87mL ISOVUE-300 IOPAMIDOL (ISOVUE-300) INJECTION 61% COMPARISON:  None. FINDINGS: Pharynx and larynx: There is significant obscuration of much of the oropharynx and hypopharynx due to artifact from dental amalgam. No mucosal lesion or pharyngeal mass. There is an effusion in the retropharyngeal space, but no enhancement to suggest retropharyngeal abscess. Mildly edematous epiglottis. Normal larynx. Salivary glands: There is marked enlargement, edema, and enhancement of both parotid glands, greater on the LEFT, with inflammatory change and fluid tracking downward into the neck. Given the elevated white count, infectious bacterial or viral parotitis is suggested. No evidence for parotid abscess. No definite involvement of the submandibular glands. Thyroid: Normal.  Lymph nodes: There may be reactive level 2 lymph nodes, but no pathologic adenopathy is seen. Vascular: Carotid bifurcation atherosclerosis. No definite vascular occlusion or septic thrombophlebitis. Calcification of the cavernous internal carotid arteries consistent with cerebrovascular atherosclerotic disease. Limited intracranial: Mild atrophy, not unexpected for age. Visualized orbits: BILATERAL cataract extraction. Mastoids and visualized paranasal sinuses: No mastoid fluid. Skeleton: Advanced spondylosis.  No worrisome osseous features. Upper chest: No pneumothorax or consolidation. Other: None. IMPRESSION: Marked enlargement, edema, and enhancement of both parotid glands, greater on the LEFT, consistent with infectious parotitis this could be bacterial or viral. No evidence for parotid abscess. No pathologic lymphadenopathy. Significant artifact from patient's dental work, obscures much of the pharynx. There is a small retropharyngeal effusion, but no evidence for tonsillar or pharyngeal mass. Electronically Signed   By: Staci Righter M.D.   On: 01/06/2017 10:12     Not all labs, radiology exams or other studies done during hospitalization come through on my EPIC note; however they are reviewed by me.    Assessment and Plan  MRSA SEPSIS-positive on culture [facility] a common hospital ED; TTE did not reveal any vegetations, CT abdomen and pelvis on 12/346; blood cultures remained negative on repeat draw during hospitalization; patient remains on vancomycin SNF - patient admitted with generalized weakness for OT/PT and for continued vancomycin until 02/10/17  ESCHERICHIA COLI UTI/CHRONIC INDWELLING FOLEY-not stated what antibiotic was used to treat; was stated that patient had history of resistant organisms in the past but per infectious disease note which is the only note in the packet that has any useful information it did not  appear to be a ESBL  BILATERAL PAROTITIS-thought to be viral in etiology;  no abscess or lymphadenopathy per CT; HEENT was consulted  DYSPHASIA-patient on ground, nectar thick liquids as per speech therapy recommendations; diet upgraded to thin liquids on 12/11 SNF - patient will be evaluated by our speech therapy  DECUBITUS ULCERS-stated to be present on arrival; on sacrum left lower extremity and heel SNF - wound care nurse at facility will be attending patient  HYPERTENSION SNF - stable; plan to continue Norvasc 10 mg by mouth daily Cozaar 50 mg by mouth daily and metoprolol 75 mg twice a day  Diabetes mellitus type 2 SNF - not stated as uncontrolled; plan to continue glipizide 2.5 mg by mouth daily and Glucophage 1000 mg by mouth twice a day; patient is on ARB and on statin  HYPERLIPIDEMIA SNF - not stated as uncontrolled; plan to continue Pravachol 40 mg by mouth daily  HISTORY of stroke SNF - plan to continue ASA 325 mg by mouth daily, and Plavix 75 mg by mouth daily   Time spent greater than 45 minutes;> 50% of time with patient was spent reviewing records, labs, tests and studies, counseling and developing plan of care  Webb Silversmith D. Sheppard Coil, MD

## 2017-01-19 ENCOUNTER — Encounter: Payer: Self-pay | Admitting: Internal Medicine

## 2017-01-19 DIAGNOSIS — L89899 Pressure ulcer of other site, unspecified stage: Secondary | ICD-10-CM | POA: Insufficient documentation

## 2017-01-19 DIAGNOSIS — A4102 Sepsis due to Methicillin resistant Staphylococcus aureus: Secondary | ICD-10-CM | POA: Insufficient documentation

## 2017-01-19 DIAGNOSIS — N390 Urinary tract infection, site not specified: Secondary | ICD-10-CM

## 2017-01-19 DIAGNOSIS — B9620 Unspecified Escherichia coli [E. coli] as the cause of diseases classified elsewhere: Secondary | ICD-10-CM | POA: Insufficient documentation

## 2017-01-19 DIAGNOSIS — K1121 Acute sialoadenitis: Secondary | ICD-10-CM | POA: Insufficient documentation

## 2017-01-19 DIAGNOSIS — L89609 Pressure ulcer of unspecified heel, unspecified stage: Secondary | ICD-10-CM | POA: Insufficient documentation

## 2017-02-07 LAB — BASIC METABOLIC PANEL
BUN: 21 (ref 4–21)
CREATININE: 0.7 (ref 0.5–1.1)
GLUCOSE: 169
POTASSIUM: 4.1 (ref 3.4–5.3)
SODIUM: 137 (ref 137–147)

## 2017-02-07 LAB — CBC AND DIFFERENTIAL
HCT: 30 — AB (ref 36–46)
HEMOGLOBIN: 10.1 — AB (ref 12.0–16.0)
Platelets: 489 — AB (ref 150–399)
WBC: 14.8

## 2017-02-13 ENCOUNTER — Non-Acute Institutional Stay (SKILLED_NURSING_FACILITY): Payer: Medicare Other | Admitting: Internal Medicine

## 2017-02-13 ENCOUNTER — Encounter: Payer: Self-pay | Admitting: Internal Medicine

## 2017-02-13 DIAGNOSIS — E559 Vitamin D deficiency, unspecified: Secondary | ICD-10-CM

## 2017-02-13 DIAGNOSIS — I6930 Unspecified sequelae of cerebral infarction: Secondary | ICD-10-CM | POA: Diagnosis not present

## 2017-02-13 DIAGNOSIS — E119 Type 2 diabetes mellitus without complications: Secondary | ICD-10-CM | POA: Diagnosis not present

## 2017-02-13 NOTE — Progress Notes (Signed)
Location:  [place] Room Number: 205-D Place of Service:  SNF (31)  Hennie Duos, MD  Patient Care Team: Hennie Duos, MD as PCP - General (Internal Medicine)  Extended Emergency Contact Information Primary Emergency Contact: Nigh,[name] Address: [address]          [place], [place] [address] [name] of [place] Phone: [phone] Relation: Son Secondary Emergency Contact: [facility] Phone: [phone] Relation: Other    Allergies: Patient has no known allergies.  Chief Complaint  Patient presents with  . Medical Management of Chronic Issues    Routine Visit     HPI: Patient is 84 y.o. female who is being seen for routine issues of vitamin D deficiency, history of stroke with residual deficit, and diabetes mellitus type 2.   Past Medical History:  Diagnosis Date  . Acute encephalopathy 08/20/2016  . B12 deficiency 08/20/2016  . Breast cancer ([place])   . Diabetes mellitus type 2, controlled, without complications ([place]) 0938  . Dysphagia 09/06/2016  . Effusion of right knee 08/18/2016  . Essential hypertension   . History of stroke with residual deficit 08/20/2016  . Hyperlipidemia   . Hypomagnesemia   . Inflammatory polyarthritis ([place]) 09/19/2016  . Leucocytosis   . Macular degeneration   . Osteoarthritis of right knee   . Physical debility   . Sacral decubitus ulcer, stage II 10/28/2016  . Sepsis ([place]) 10/28/2016  . Stroke [facility]) 2011   left sided weakness baseline  . Urinary incontinence in female   . UTI (urinary tract infection) 09/19/2016  . Vascular dementia   . Vitamin B12 deficiency     Past Surgical History:  Procedure Laterality Date  . ABDOMINAL HYSTERECTOMY    . BREAST LUMPECTOMY    . CATARACT EXTRACTION      Allergies as of 02/13/2017   No Known Allergies     Medication List        Accurate as of 02/13/17 11:59 PM. Always use your most recent med list.          acetaminophen  500 MG tablet Commonly known as:  TYLENOL Take 500 mg by mouth every 6 (six) hours as needed for mild pain.   amLODipine 10 MG tablet Commonly known as:  NORVASC Take 10 mg by mouth daily.   aspirin 325 MG tablet Take 325 mg by mouth at bedtime.   BIOFREEZE 4 % Gel Generic drug:  Menthol (Topical Analgesic) Apply topically. Apply one application topically to both knees 3 times daily as needed   CALCIUM 600-D PO Take 1 tablet by mouth daily.   Cholecalciferol 1000 units tablet Take 1,000 Units by mouth daily.   clopidogrel 75 MG tablet Commonly known as:  PLAVIX Take 75 mg by mouth daily.   glipiZIDE 2.5 MG 24 hr tablet Commonly known as:  GLUCOTROL XL Take 2.5 mg by mouth daily.   Lidocaine 4 % Ptch Apply topically. Place one patch on the skin daily to right knee   losartan 50 MG tablet Commonly known as:  COZAAR Take 50 mg by mouth daily.   metFORMIN 1000 MG tablet Commonly known as:  GLUCOPHAGE Take 1,000 mg by mouth 2 (two) times daily with a meal.   metoprolol tartrate 50 MG tablet Commonly known as:  LOPRESSOR Take 75 mg by mouth 2 (two) times daily.   multivitamin tablet Take 1 tablet by mouth daily.   NON FORMULARY Med pass 120 mL by mouth 2  times daily   omeprazole 20 MG capsule Commonly known as:  PRILOSEC Take 20 mg by mouth 2 (two) times daily.   ondansetron 4 MG tablet Commonly known as:  ZOFRAN Take 4 mg by mouth every 6 (six) hours as needed for nausea.   polyvinyl alcohol 1.4 % ophthalmic solution Commonly known as:  LIQUIFILM TEARS 1 drop. One drop to both eyes four times a day   pravastatin 40 MG tablet Commonly known as:  PRAVACHOL Take 40 mg by mouth at bedtime.   saccharomyces boulardii 250 MG capsule Commonly known as:  FLORASTOR Take 250 mg by mouth 2 (two) times daily.   traMADol 50 MG tablet Commonly known as:  ULTRAM Take 50 mg by mouth at bedtime.   vitamin B-12 1000 MCG tablet Commonly known as:   CYANOCOBALAMIN Take 1,000 mcg by mouth daily.       No orders of the defined types were placed in this encounter.   Immunization History  Administered Date(s) Administered  . Influenza-Unspecified 11/06/2015, 11/26/2016  . PPD Test 08/18/2016  . Pneumococcal Conjugate-13 12/14/2016  . Pneumococcal-Unspecified 09/06/2015  . Tdap 12/15/2016    Social History   Tobacco Use  . Smoking status: Never Smoker  . Smokeless tobacco: Never Used  Substance Use Topics  . Alcohol use: No    Review of Systems  DATA OBTAINED: from patient, nurse GENERAL:  no fevers, fatigue, appetite changes SKIN: No itching, rash HEENT: No complaint RESPIRATORY: No cough, wheezing, SOB CARDIAC: No chest pain, palpitations, lower extremity edema  GI: No abdominal pain, No N/V/D or constipation, No heartburn or reflux  GU: No dysuria, frequency or urgency, or incontinence  MUSCULOSKELETAL: No unrelieved bone/joint pain NEUROLOGIC: No headache, dizziness  PSYCHIATRIC: No overt anxiety or sadness  Vitals:   02/13/17 1135  BP: 134/70  Pulse: 66  Resp: 16  Temp: 97.8 F (36.6 C)  SpO2: 97%   Body mass index is 20.85 kg/m. Physical Exam  GENERAL APPEARANCE: Alert, conversant, No acute distress  SKIN: No diaphoresis rash HEENT: Unremarkable RESPIRATORY: Breathing is even, unlabored. Lung sounds are clear   CARDIOVASCULAR: Heart RRR no murmurs, rubs or gallops. No peripheral edema  GASTROINTESTINAL: Abdomen is soft, non-tender, not distended w/ normal bowel sounds.  GENITOURINARY: Bladder non tender, not distended  MUSCULOSKELETAL: No abnormal joints or musculature NEUROLOGIC: Cranial nerves 2-12 grossly intact. Moves all extremities PSYCHIATRIC: Mood and affect appropriate to situation, no behavioral issues  Patient Active Problem List   Diagnosis Date Noted  . Vitamin D deficiency 03/02/2017  . MRSA (methicillin resistant Staphylococcus aureus) septicemia ([place]) 01/19/2017  . Escherichia  coli urinary tract infection 01/19/2017  . Parotitis, acute 01/19/2017  . Decubitus ulcer of heel 01/19/2017  . Decubitus ulcer of left lower extremity 01/19/2017  . Sepsis ([place]) 10/28/2016  . Sacral decubitus ulcer, stage II 10/28/2016  . Acute urinary retention 10/28/2016  . GERD (gastroesophageal reflux disease) 10/28/2016  . Esophageal stricture 09/19/2016  . Vomiting and diarrhea 09/19/2016  . Bacterial overgrowth syndrome 09/19/2016  . Inflammatory polyarthritis ([place]) 09/19/2016  . Septic arthritis of knee, right ([place]) 09/19/2016  . UTI (urinary tract infection) due to urinary indwelling Foley catheter ([place]) 09/19/2016  . Dysphagia 09/06/2016  . Arthritis 08/20/2016  . Acute encephalopathy 08/20/2016  . B12 deficiency 08/20/2016  . Acute diarrhea 08/20/2016  . History of stroke with residual deficit 08/20/2016  . Hyperlipidemia 08/20/2016  . Vascular dementia 08/20/2016  . Essential hypertension 08/20/2016  . Yeast infection involving the vagina  and surrounding area 08/20/2016  . Effusion of right knee 08/18/2016  . Diabetes mellitus type 2, controlled, without complications ([place]) 09/62/8366    CMP     Component Value Date/Time   NA 137 02/14/2017   K 4.0 02/14/2017   CL 99 (L) 01/06/2017 0758   CO2 21 (L) 01/06/2017 0758   GLUCOSE 193 (H) 01/06/2017 0758   BUN 20 02/14/2017   CREATININE 0.5 02/14/2017   CREATININE 0.61 01/06/2017 0758   CALCIUM 8.7 (L) 01/06/2017 0758   PROT 7.0 01/06/2017 0758   ALBUMIN 2.4 (L) 01/06/2017 0758   AST 28 01/06/2017 0758   ALT 18 01/06/2017 0758   ALKPHOS 65 01/06/2017 0758   BILITOT 0.8 01/06/2017 0758   GFRNONAA >60 01/06/2017 0758   GFRAA >60 01/06/2017 0758   Recent Labs    01/01/17 01/06/17 0758 01/06/17 0914 02/14/17  NA 135* 132*  --  137  K 4.5 4.7 3.4* 4.0  CL  --  99*  --   --   CO2  --  21*  --   --   GLUCOSE  --  193*  --   --   BUN 30* 19  --  20  CREATININE 0.7 0.61  --  0.5  CALCIUM  --  8.7*  --   --     Recent Labs    11/26/16 01/01/17 01/06/17 0758  AST 15 11* 28  ALT 10 8 18   ALKPHOS 76 69 65  BILITOT  --   --  0.8  PROT  --   --  7.0  ALBUMIN  --   --  2.4*   Recent Labs    01/01/17 01/06/17 0710 02/14/17  WBC 16.2 40.4* 13.0  NEUTROABS  --  34.3*  --   HGB 9.5* 9.8* 9.4*  HCT 29* 30.2* 29*  MCV  --  81.2  --   PLT 443* 635* 471*   Recent Labs    11/26/16 01/01/17  CHOL 164 108  LDLCALC 63 37  TRIG 222* 129   No results found for: [facility] Lab Results  Component Value Date   TSH 1.96 01/01/2017   Lab Results  Component Value Date   HGBA1C 6.6 01/01/2017   Lab Results  Component Value Date   CHOL 108 01/01/2017   HDL 46 01/01/2017   LDLCALC 37 01/01/2017   TRIG 129 01/01/2017    Significant Diagnostic Results in last 30 days:  No results found.  Assessment and Plan  Vitamin D deficiency Technically insufficiency the level of 34; plan to continue vitamin D thousand units daily  History of stroke with residual deficit Stable; plan to continue Plavix 75 mg by mouth daily ASA 325 mg by mouth daily and an statin  Diabetes mellitus type 2, controlled, without complications (HCC) Q9U 6.6, excellent control; plan to continue glipizide 2.5 mg by mouth daily, Glucophage 1000 mg by mouth twice a day, patient is on ARB and statin     Inocencio Homes MD

## 2017-02-14 LAB — CBC AND DIFFERENTIAL
HEMATOCRIT: 29 — AB (ref 36–46)
HEMOGLOBIN: 9.4 — AB (ref 12.0–16.0)
PLATELETS: 471 — AB (ref 150–399)
WBC: 13

## 2017-02-14 LAB — BASIC METABOLIC PANEL
BUN: 20 (ref 4–21)
CREATININE: 0.5 (ref 0.5–1.1)
Glucose: 106
Potassium: 4 (ref 3.4–5.3)
Sodium: 137 (ref 137–147)

## 2017-03-02 ENCOUNTER — Encounter: Payer: Self-pay | Admitting: Internal Medicine

## 2017-03-02 DIAGNOSIS — E559 Vitamin D deficiency, unspecified: Secondary | ICD-10-CM | POA: Insufficient documentation

## 2017-03-02 NOTE — Assessment & Plan Note (Signed)
Stable; plan to continue Plavix 75 mg by mouth daily ASA 325 mg by mouth daily and an statin

## 2017-03-02 NOTE — Assessment & Plan Note (Signed)
Technically insufficiency the level of 34; plan to continue vitamin D thousand units daily

## 2017-03-02 NOTE — Assessment & Plan Note (Signed)
A1c 6.6, excellent control; plan to continue glipizide 2.5 mg by mouth daily, Glucophage 1000 mg by mouth twice a day, patient is on ARB and statin

## 2017-03-13 ENCOUNTER — Non-Acute Institutional Stay (SKILLED_NURSING_FACILITY): Payer: Medicare Other | Admitting: Internal Medicine

## 2017-03-13 ENCOUNTER — Encounter: Payer: Self-pay | Admitting: Internal Medicine

## 2017-03-13 DIAGNOSIS — I10 Essential (primary) hypertension: Secondary | ICD-10-CM

## 2017-03-13 DIAGNOSIS — I639 Cerebral infarction, unspecified: Secondary | ICD-10-CM | POA: Diagnosis not present

## 2017-03-13 DIAGNOSIS — K219 Gastro-esophageal reflux disease without esophagitis: Secondary | ICD-10-CM | POA: Diagnosis not present

## 2017-03-13 NOTE — Progress Notes (Signed)
: Provider:  Hennie Duos MD Location:  [facility] Room Number: 205D Place of Service:  SNF [phone])  PCP: Hennie Duos, MD Patient Care Team: Hennie Duos, MD as PCP - General (Internal Medicine)  Extended Emergency Contact Information Primary Emergency Contact: Wisler,[name] Address: [address]          [place], [place] [address] [name] of [place] Phone: [phone] Relation: Son Secondary Emergency Contact: [facility] Phone: [phone] Relation: Other     Allergies: Patient has no known allergies.  Chief Complaint  Patient presents with  . Medical Management of Chronic Issues    HPI: Patient is 84 y.o. female who is being seen for routine issues of history of CVA, hypertension, and GERD.  Past Medical History:  Diagnosis Date  . Acute encephalopathy 08/20/2016  . B12 deficiency 08/20/2016  . Breast cancer ([place])   . Diabetes mellitus type 2, controlled, without complications ([place]) 5809  . Dysphagia 09/06/2016  . Effusion of right knee 08/18/2016  . Essential hypertension   . History of stroke with residual deficit 08/20/2016  . Hyperlipidemia   . Hypomagnesemia   . Inflammatory polyarthritis ([place]) 09/19/2016  . Leucocytosis   . Macular degeneration   . Osteoarthritis of right knee   . Physical debility   . Sacral decubitus ulcer, stage II 10/28/2016  . Sepsis ([place]) 10/28/2016  . Stroke [facility]) 2011   left sided weakness baseline  . Urinary incontinence in female   . UTI (urinary tract infection) 09/19/2016  . Vascular dementia   . Vitamin B12 deficiency     Past Surgical History:  Procedure Laterality Date  . ABDOMINAL HYSTERECTOMY    . BREAST LUMPECTOMY    . CATARACT EXTRACTION      Allergies as of 03/13/2017   No Known Allergies     Medication List        Accurate as of 03/13/17 11:59 PM. Always use your most recent med list.          acetaminophen 500 MG tablet Commonly known as:   TYLENOL Take 1,000 mg by mouth daily with breakfast.   acetaminophen 500 MG tablet Commonly known as:  TYLENOL Take 500 mg by mouth every 6 (six) hours as needed.   amLODipine 10 MG tablet Commonly known as:  NORVASC Take 10 mg by mouth daily.   aspirin 325 MG tablet Take 325 mg by mouth at bedtime.   BIOFREEZE 4 % Gel Generic drug:  Menthol (Topical Analgesic) Apply topically. Apply one application topically to both knees 3 times daily as needed   CALCIUM 600-D PO Take 1 tablet by mouth daily.   Cholecalciferol 1000 units tablet Take 1,000 Units by mouth daily.   clopidogrel 75 MG tablet Commonly known as:  PLAVIX Take 75 mg by mouth daily.   glipiZIDE 2.5 MG 24 hr tablet Commonly known as:  GLUCOTROL XL Take 2.5 mg by mouth daily.   Lidocaine 4 % Ptch Apply topically. Place one patch on the skin daily to right knee   losartan 50 MG tablet Commonly known as:  COZAAR Take 50 mg by mouth daily.   metFORMIN 1000 MG tablet Commonly known as:  GLUCOPHAGE Take 1,000 mg by mouth 2 (two) times daily with a meal.   metoprolol tartrate 50 MG tablet Commonly known as:  LOPRESSOR Take 75 mg by mouth 2 (two) times daily.   multivitamin tablet Take 1 tablet by mouth daily.  NON FORMULARY Med pass 120 mL by mouth 2 times daily   omeprazole 20 MG capsule Commonly known as:  PRILOSEC Take 20 mg by mouth 2 (two) times daily.   ondansetron 4 MG tablet Commonly known as:  ZOFRAN Take 4 mg by mouth every 6 (six) hours as needed for nausea.   polyvinyl alcohol 1.4 % ophthalmic solution Commonly known as:  LIQUIFILM TEARS 1 drop. One drop to both eyes four times a day   pravastatin 40 MG tablet Commonly known as:  PRAVACHOL Take 40 mg by mouth at bedtime.   saccharomyces boulardii 250 MG capsule Commonly known as:  FLORASTOR Take 250 mg by mouth 2 (two) times daily.   traMADol 50 MG tablet Commonly known as:  ULTRAM Take 50 mg by mouth at bedtime.   vitamin  B-12 1000 MCG tablet Commonly known as:  CYANOCOBALAMIN Take 1,000 mcg by mouth daily.       No orders of the defined types were placed in this encounter.   Immunization History  Administered Date(s) Administered  . Influenza-Unspecified 11/06/2015, 11/26/2016  . PPD Test 08/18/2016  . Pneumococcal Conjugate-13 12/14/2016  . Pneumococcal-Unspecified 09/06/2015  . Tdap 12/15/2016    Social History   Tobacco Use  . Smoking status: Never Smoker  . Smokeless tobacco: Never Used  Substance Use Topics  . Alcohol use: No    Family history is   Family History  Problem Relation Age of Onset  . Hypertension Mother   . Macular degeneration Father   . Stroke Father   . Macular degeneration Sister       Review of Systems  DATA OBTAINED: from patient, nurse, medical record, family member GENERAL:  no fevers, fatigue, appetite changes SKIN: No itching, or rash EYES: No eye pain, redness, discharge EARS: No earache, tinnitus, change in hearing NOSE: No congestion, drainage or bleeding  MOUTH/THROAT: No mouth or tooth pain, No sore throat RESPIRATORY: No cough, wheezing, SOB CARDIAC: No chest pain, palpitations, lower extremity edema  GI: No abdominal pain, No N/V/D or constipation, No heartburn or reflux  GU: No dysuria, frequency or urgency, or incontinence  MUSCULOSKELETAL: No unrelieved bone/joint pain NEUROLOGIC: No headache, dizziness or focal weakness PSYCHIATRIC: No c/o anxiety or sadness   Vitals:   03/13/17 1112  BP: 132/78  Pulse: 78  Resp: 18  Temp: 97.9 F (36.6 C)  SpO2: 97%    SpO2 Readings from Last 1 Encounters:  03/13/17 97%   Body mass index is 20.3 kg/m.     Physical Exam  GENERAL APPEARANCE: Alert, conversant,  No acute distress.  SKIN: No diaphoresis rash HEAD: Normocephalic, atraumatic  EYES: Conjunctiva/lids clear. Pupils round, reactive. EOMs intact.  EARS: External exam WNL, canals clear. Hearing grossly normal.  NOSE: No  deformity or discharge.  MOUTH/THROAT: Lips w/o lesions  RESPIRATORY: Breathing is even, unlabored. Lung sounds are clear   CARDIOVASCULAR: Heart RRR no murmurs, rubs or gallops. No peripheral edema.   GASTROINTESTINAL: Abdomen is soft, non-tender, not distended w/ normal bowel sounds. GENITOURINARY: Bladder non tender, not distended  MUSCULOSKELETAL: Contracture left wrist/hand NEUROLOGIC:  Cranial nerves 2-12 grossly intact. Moves all extremities  PSYCHIATRIC: Mood and affect appropriate to situation, no behavioral issues  Patient Active Problem List   Diagnosis Date Noted  . Vitamin D deficiency 03/02/2017  . MRSA (methicillin resistant Staphylococcus aureus) septicemia ([place]) 01/19/2017  . Escherichia coli urinary tract infection 01/19/2017  . Parotitis, acute 01/19/2017  . Decubitus ulcer of heel 01/19/2017  .  Decubitus ulcer of left lower extremity 01/19/2017  . Sepsis ([place]) 10/28/2016  . Sacral decubitus ulcer, stage II 10/28/2016  . Acute urinary retention 10/28/2016  . GERD (gastroesophageal reflux disease) 10/28/2016  . Esophageal stricture 09/19/2016  . Vomiting and diarrhea 09/19/2016  . Bacterial overgrowth syndrome 09/19/2016  . Inflammatory polyarthritis ([place]) 09/19/2016  . Septic arthritis of knee, right ([place]) 09/19/2016  . UTI (urinary tract infection) due to urinary indwelling Foley catheter ([place]) 09/19/2016  . Dysphagia 09/06/2016  . Arthritis 08/20/2016  . Acute encephalopathy 08/20/2016  . B12 deficiency 08/20/2016  . Acute diarrhea 08/20/2016  . History of stroke with residual deficit 08/20/2016  . Hyperlipidemia 08/20/2016  . Vascular dementia 08/20/2016  . Essential hypertension 08/20/2016  . Yeast infection involving the vagina and surrounding area 08/20/2016  . Effusion of right knee 08/18/2016  . Stroke ([place]) 02/05/2009  . Diabetes mellitus type 2, controlled, without complications ([place]) 54/62/7035      Labs reviewed: Basic Metabolic Panel:      Component Value Date/Time   NA 137 02/14/2017   K 4.0 02/14/2017   CL 99 (L) 01/06/2017 0758   CO2 21 (L) 01/06/2017 0758   GLUCOSE 193 (H) 01/06/2017 0758   BUN 20 02/14/2017   CREATININE 0.5 02/14/2017   CREATININE 0.61 01/06/2017 0758   CALCIUM 8.7 (L) 01/06/2017 0758   PROT 7.0 01/06/2017 0758   ALBUMIN 2.4 (L) 01/06/2017 0758   AST 28 01/06/2017 0758   ALT 18 01/06/2017 0758   ALKPHOS 65 01/06/2017 0758   BILITOT 0.8 01/06/2017 0758   GFRNONAA >60 01/06/2017 0758   GFRAA >60 01/06/2017 0758    Recent Labs    01/01/17 01/06/17 0758 01/06/17 0914 02/14/17  NA 135* 132*  --  137  K 4.5 4.7 3.4* 4.0  CL  --  99*  --   --   CO2  --  21*  --   --   GLUCOSE  --  193*  --   --   BUN 30* 19  --  20  CREATININE 0.7 0.61  --  0.5  CALCIUM  --  8.7*  --   --    Liver Function Tests: Recent Labs    11/26/16 01/01/17 01/06/17 0758  AST 15 11* 28  ALT 10 8 18   ALKPHOS 76 69 65  BILITOT  --   --  0.8  PROT  --   --  7.0  ALBUMIN  --   --  2.4*   No results for input(s): LIPASE, AMYLASE in the last 8760 hours. No results for input(s): AMMONIA in the last 8760 hours. CBC: Recent Labs    01/01/17 01/06/17 0710 02/14/17  WBC 16.2 40.4* 13.0  NEUTROABS  --  34.3*  --   HGB 9.5* 9.8* 9.4*  HCT 29* 30.2* 29*  MCV  --  81.2  --   PLT 443* 635* 471*   Lipid Recent Labs    11/26/16 01/01/17  CHOL 164 108  HDL 57 46  LDLCALC 63 37  TRIG 222* 129    Cardiac Enzymes: No results for input(s): CKTOTAL, CKMB, CKMBINDEX, TROPONINI in the last 8760 hours. BNP: No results for input(s): BNP in the last 8760 hours. No results found for: [facility] Lab Results  Component Value Date   HGBA1C 6.6 01/01/2017   Lab Results  Component Value Date   TSH 1.96 01/01/2017   Lab Results  Component Value Date   VITAMINB12 1,055 11/26/2016   No results  found for: FOLATE No results found for: IRON, TIBC, FERRITIN  Imaging and Procedures obtained prior to SNF admission: Dg  Chest 2 View  Result Date: 01/06/2017 CLINICAL DATA:  Weakness EXAM: CHEST  2 VIEW COMPARISON:  None. FINDINGS: Heart and mediastinal contours are within normal limits. No focal opacities or effusions. No acute bony abnormality. IMPRESSION: No active cardiopulmonary disease. Electronically Signed   By: Rolm Baptise M.D.   On: 01/06/2017 07:56   Ct Soft Tissue Neck W Contrast  Result Date: 01/06/2017 CLINICAL DATA:  Nursing home patient with facial swelling. Elevated white count. EXAM: CT NECK WITH CONTRAST TECHNIQUE: Multidetector CT imaging of the neck was performed using the standard protocol following the bolus administration of intravenous contrast. CONTRAST:  17mL ISOVUE-300 IOPAMIDOL (ISOVUE-300) INJECTION 61% COMPARISON:  None. FINDINGS: Pharynx and larynx: There is significant obscuration of much of the oropharynx and hypopharynx due to artifact from dental amalgam. No mucosal lesion or pharyngeal mass. There is an effusion in the retropharyngeal space, but no enhancement to suggest retropharyngeal abscess. Mildly edematous epiglottis. Normal larynx. Salivary glands: There is marked enlargement, edema, and enhancement of both parotid glands, greater on the LEFT, with inflammatory change and fluid tracking downward into the neck. Given the elevated white count, infectious bacterial or viral parotitis is suggested. No evidence for parotid abscess. No definite involvement of the submandibular glands. Thyroid: Normal. Lymph nodes: There may be reactive level 2 lymph nodes, but no pathologic adenopathy is seen. Vascular: Carotid bifurcation atherosclerosis. No definite vascular occlusion or septic thrombophlebitis. Calcification of the cavernous internal carotid arteries consistent with cerebrovascular atherosclerotic disease. Limited intracranial: Mild atrophy, not unexpected for age. Visualized orbits: BILATERAL cataract extraction. Mastoids and visualized paranasal sinuses: No mastoid fluid. Skeleton:  Advanced spondylosis.  No worrisome osseous features. Upper chest: No pneumothorax or consolidation. Other: None. IMPRESSION: Marked enlargement, edema, and enhancement of both parotid glands, greater on the LEFT, consistent with infectious parotitis this could be bacterial or viral. No evidence for parotid abscess. No pathologic lymphadenopathy. Significant artifact from patient's dental work, obscures much of the pharynx. There is a small retropharyngeal effusion, but no evidence for tonsillar or pharyngeal mass. Electronically Signed   By: Staci Righter M.D.   On: 01/06/2017 10:12     Not all labs, radiology exams or other studies done during hospitalization come through on my EPIC note; however they are reviewed by me.    Assessment and Plan  Stroke [facility]) No problems; continue ASA 325 mg by mouth daily as prophylaxis  Essential hypertension Controlled; continue metoprolol 75 mg twice a day, Norvasc 10 mg by mouth daily and Cozaar 50 mg daily  GERD (gastroesophageal reflux disease) Controlled; continue omeprazole 20 mg twice a day   Vi Biddinger D. Sheppard Coil, MD

## 2017-03-16 ENCOUNTER — Encounter: Payer: Self-pay | Admitting: Internal Medicine

## 2017-03-16 NOTE — Assessment & Plan Note (Signed)
Controlled; continue metoprolol 75 mg twice a day, Norvasc 10 mg by mouth daily and Cozaar 50 mg daily

## 2017-03-16 NOTE — Assessment & Plan Note (Signed)
Controlled; continue omeprazole 20 mg twice a day

## 2017-03-16 NOTE — Assessment & Plan Note (Signed)
No problems; continue ASA 325 mg by mouth daily as prophylaxis

## 2017-03-29 ENCOUNTER — Non-Acute Institutional Stay (SKILLED_NURSING_FACILITY): Payer: Medicare Other | Admitting: Internal Medicine

## 2017-03-29 DIAGNOSIS — N3000 Acute cystitis without hematuria: Secondary | ICD-10-CM

## 2017-04-04 ENCOUNTER — Encounter: Payer: Self-pay | Admitting: Internal Medicine

## 2017-04-04 NOTE — Progress Notes (Signed)
Location:  [place] of Service:  SNF (31)  Hennie Duos, MD  Patient Care Team: Hennie Duos, MD as PCP - General (Internal Medicine)  Extended Emergency Contact Information Primary Emergency Contact: Lecount,[name] Address: [address]          [place], [place] [address] [name] of [place] Phone: [phone] Relation: Son Secondary Emergency Contact: [facility] Phone: [phone] Relation: Other    Allergies: Patient has no known allergies.  Chief Complaint  Patient presents with  . Acute Visit    HPI: Patient is 84 y.o. female who is being seen for possible UTI. Patient has had decreased appetite and foul-smelling urine with dysuria; looks sick; patient was admitted to hospital for urosepsis recently; patient's urine is growing greater than 80,000 gram-negative rods and her white count is 15.8  Past Medical History:  Diagnosis Date  . Acute encephalopathy 08/20/2016  . B12 deficiency 08/20/2016  . Breast cancer ([place])   . Diabetes mellitus type 2, controlled, without complications ([place]) 0277  . Dysphagia 09/06/2016  . Effusion of right knee 08/18/2016  . Essential hypertension   . History of stroke with residual deficit 08/20/2016  . Hyperlipidemia   . Hypomagnesemia   . Inflammatory polyarthritis ([place]) 09/19/2016  . Leucocytosis   . Macular degeneration   . Osteoarthritis of right knee   . Physical debility   . Sacral decubitus ulcer, stage II 10/28/2016  . Sepsis ([place]) 10/28/2016  . Stroke [facility]) 2011   left sided weakness baseline  . Urinary incontinence in female   . UTI (urinary tract infection) 09/19/2016  . Vascular dementia   . Vitamin B12 deficiency     Past Surgical History:  Procedure Laterality Date  . ABDOMINAL HYSTERECTOMY    . BREAST LUMPECTOMY    . CATARACT EXTRACTION      Allergies as of 03/29/2017   No Known Allergies     Medication List        Accurate as of 03/29/17 11:59 PM.  Always use your most recent med list.          acetaminophen 500 MG tablet Commonly known as:  TYLENOL Take 1,000 mg by mouth daily with breakfast.   acetaminophen 500 MG tablet Commonly known as:  TYLENOL Take 500 mg by mouth every 6 (six) hours as needed.   amLODipine 10 MG tablet Commonly known as:  NORVASC Take 10 mg by mouth daily.   aspirin 325 MG tablet Take 325 mg by mouth at bedtime.   BIOFREEZE 4 % Gel Generic drug:  Menthol (Topical Analgesic) Apply topically. Apply one application topically to both knees 3 times daily as needed   CALCIUM 600-D PO Take 1 tablet by mouth daily.   Cholecalciferol 1000 units tablet Take 1,000 Units by mouth daily.   clopidogrel 75 MG tablet Commonly known as:  PLAVIX Take 75 mg by mouth daily.   glipiZIDE 2.5 MG 24 hr tablet Commonly known as:  GLUCOTROL XL Take 2.5 mg by mouth daily.   Lidocaine 4 % Ptch Apply topically. Place one patch on the skin daily to right knee   losartan 50 MG tablet Commonly known as:  COZAAR Take 50 mg by mouth daily.   metFORMIN 1000 MG tablet Commonly known as:  GLUCOPHAGE Take 1,000 mg by mouth 2 (two) times daily with a meal.   metoprolol tartrate 50 MG tablet Commonly known as:  LOPRESSOR Take 75 mg by mouth 2 (two)  times daily.   multivitamin tablet Take 1 tablet by mouth daily.   NON FORMULARY Med pass 120 mL by mouth 2 times daily   omeprazole 20 MG capsule Commonly known as:  PRILOSEC Take 20 mg by mouth 2 (two) times daily.   ondansetron 4 MG tablet Commonly known as:  ZOFRAN Take 4 mg by mouth every 6 (six) hours as needed for nausea.   polyvinyl alcohol 1.4 % ophthalmic solution Commonly known as:  LIQUIFILM TEARS 1 drop. One drop to both eyes four times a day   pravastatin 40 MG tablet Commonly known as:  PRAVACHOL Take 40 mg by mouth at bedtime.   saccharomyces boulardii 250 MG capsule Commonly known as:  FLORASTOR Take 250 mg by mouth 2 (two) times daily.    traMADol 50 MG tablet Commonly known as:  ULTRAM Take 50 mg by mouth at bedtime.   vitamin B-12 1000 MCG tablet Commonly known as:  CYANOCOBALAMIN Take 1,000 mcg by mouth daily.       No orders of the defined types were placed in this encounter.   Immunization History  Administered Date(s) Administered  . Influenza-Unspecified 11/06/2015, 11/26/2016  . PPD Test 08/18/2016  . Pneumococcal Conjugate-13 12/14/2016  . Pneumococcal-Unspecified 09/06/2015  . Tdap 12/15/2016    Social History   Tobacco Use  . Smoking status: Never Smoker  . Smokeless tobacco: Never Used  Substance Use Topics  . Alcohol use: No    Review of Systems  DATA OBTAINED: from patient, nurse-as presented in present illness GENERAL:  no fevers, fatigue,+ appetite changes SKIN: No itching, rash HEENT: No complaint RESPIRATORY: No cough, wheezing, SOB CARDIAC: No chest pain, palpitations, lower extremity edema  GI: No abdominal pain, No N/V/D or constipation, No heartburn or reflux  GU: + dysuria, frequency;, urgency, or incontinence  MUSCULOSKELETAL: No unrelieved bone/joint pain NEUROLOGIC: No headache, dizziness  PSYCHIATRIC: No overt anxiety or sadness  Vitals:   04/04/17 1110  BP: 123/79  Pulse: 77  Resp: 18  Temp: 98.9 F (37.2 C)   There is no height or weight on file to calculate BMI. Physical Exam  GENERAL APPEARANCE: Alert, conversant, No acute distress  SKIN: No diaphoresis rash HEENT: Unremarkable RESPIRATORY: Breathing is even, unlabored. Lung sounds are clear   CARDIOVASCULAR: Heart RRR no murmurs, rubs or gallops. No peripheral edema  GASTROINTESTINAL: Abdomen is soft, non-tender, not distended w/ normal bowel sounds.  GENITOURINARY: Bladder non tender, not distended  MUSCULOSKELETAL: No abnormal joints or musculature except contracture left wrist NEUROLOGIC: Cranial nerves 2-12 grossly intact. Moves all extremities PSYCHIATRIC: Mood and affect appropriate to situation  with dementia, no behavioral issues  Patient Active Problem List   Diagnosis Date Noted  . Vitamin D deficiency 03/02/2017  . MRSA (methicillin resistant Staphylococcus aureus) septicemia ([place]) 01/19/2017  . Escherichia coli urinary tract infection 01/19/2017  . Parotitis, acute 01/19/2017  . Decubitus ulcer of heel 01/19/2017  . Decubitus ulcer of left lower extremity 01/19/2017  . Sepsis ([place]) 10/28/2016  . Sacral decubitus ulcer, stage II 10/28/2016  . Acute urinary retention 10/28/2016  . GERD (gastroesophageal reflux disease) 10/28/2016  . Esophageal stricture 09/19/2016  . Vomiting and diarrhea 09/19/2016  . Bacterial overgrowth syndrome 09/19/2016  . Inflammatory polyarthritis ([place]) 09/19/2016  . Septic arthritis of knee, right ([place]) 09/19/2016  . UTI (urinary tract infection) due to urinary indwelling Foley catheter ([place]) 09/19/2016  . Dysphagia 09/06/2016  . Arthritis 08/20/2016  . Acute encephalopathy 08/20/2016  . B12 deficiency 08/20/2016  .  Acute diarrhea 08/20/2016  . History of stroke with residual deficit 08/20/2016  . Hyperlipidemia 08/20/2016  . Vascular dementia 08/20/2016  . Essential hypertension 08/20/2016  . Yeast infection involving the vagina and surrounding area 08/20/2016  . Effusion of right knee 08/18/2016  . Stroke ([place]) 02/05/2009  . Diabetes mellitus type 2, controlled, without complications ([place]) 08/01/9483    CMP     Component Value Date/Time   NA 137 02/14/2017   K 4.0 02/14/2017   CL 99 (L) 01/06/2017 0758   CO2 21 (L) 01/06/2017 0758   GLUCOSE 193 (H) 01/06/2017 0758   BUN 20 02/14/2017   CREATININE 0.5 02/14/2017   CREATININE 0.61 01/06/2017 0758   CALCIUM 8.7 (L) 01/06/2017 0758   PROT 7.0 01/06/2017 0758   ALBUMIN 2.4 (L) 01/06/2017 0758   AST 28 01/06/2017 0758   ALT 18 01/06/2017 0758   ALKPHOS 65 01/06/2017 0758   BILITOT 0.8 01/06/2017 0758   GFRNONAA >60 01/06/2017 0758   GFRAA >60 01/06/2017 0758   Recent Labs     01/01/17 01/06/17 0758 01/06/17 0914 02/14/17  NA 135* 132*  --  137  K 4.5 4.7 3.4* 4.0  CL  --  99*  --   --   CO2  --  21*  --   --   GLUCOSE  --  193*  --   --   BUN 30* 19  --  20  CREATININE 0.7 0.61  --  0.5  CALCIUM  --  8.7*  --   --    Recent Labs    11/26/16 01/01/17 01/06/17 0758  AST 15 11* 28  ALT 10 8 18   ALKPHOS 76 69 65  BILITOT  --   --  0.8  PROT  --   --  7.0  ALBUMIN  --   --  2.4*   Recent Labs    01/01/17 01/06/17 0710 02/14/17  WBC 16.2 40.4* 13.0  NEUTROABS  --  34.3*  --   HGB 9.5* 9.8* 9.4*  HCT 29* 30.2* 29*  MCV  --  81.2  --   PLT 443* 635* 471*   Recent Labs    11/26/16 01/01/17  CHOL 164 108  LDLCALC 63 37  TRIG 222* 129   No results found for: [facility] Lab Results  Component Value Date   TSH 1.96 01/01/2017   Lab Results  Component Value Date   HGBA1C 6.6 01/01/2017   Lab Results  Component Value Date   CHOL 108 01/01/2017   HDL 46 01/01/2017   LDLCALC 37 01/01/2017   TRIG 129 01/01/2017    Significant Diagnostic Results in last 30 days:  No results found.  Assessment and   UTI probable based on her symptoms and her elevated blood count; her last urinary tract infection with Klebsiella so we chose to treat her with Rocephin 1 g IM 7 days pending the antibiotic sensitivity panel     Inocencio Homes, MD

## 2017-04-10 ENCOUNTER — Encounter: Payer: Self-pay | Admitting: Internal Medicine

## 2017-04-10 ENCOUNTER — Non-Acute Institutional Stay (SKILLED_NURSING_FACILITY): Payer: Medicare Other | Admitting: Internal Medicine

## 2017-04-10 DIAGNOSIS — N319 Neuromuscular dysfunction of bladder, unspecified: Secondary | ICD-10-CM

## 2017-04-10 DIAGNOSIS — I69998 Other sequelae following unspecified cerebrovascular disease: Secondary | ICD-10-CM

## 2017-04-10 DIAGNOSIS — M810 Age-related osteoporosis without current pathological fracture: Secondary | ICD-10-CM

## 2017-04-10 DIAGNOSIS — I69398 Other sequelae of cerebral infarction: Secondary | ICD-10-CM

## 2017-04-10 DIAGNOSIS — F0150 Vascular dementia without behavioral disturbance: Secondary | ICD-10-CM

## 2017-04-10 NOTE — Progress Notes (Signed)
Location:  [place] Room Number: 205D Place of Service:  SNF (31)  Lauren Duos, MD  Patient Care Team: Lauren Duos, MD as PCP - General (Internal Medicine)  Extended Emergency Contact Information Primary Emergency Contact: Lauren Dennis Address: [address]          [place], [place] [address] [name] of [place] Phone: [phone] Relation: Son Secondary Emergency Contact: [facility] Phone: [phone] Relation: Other    Allergies: Patient has no known allergies.  Chief Complaint  Patient presents with  . Medical Management of Chronic Issues    HPI: Patient is 84 y.o. female who osteoporosis, neurogenic bladder, and dementia.  Past Medical History:  Diagnosis Date  . Acute encephalopathy 08/20/2016  . B12 deficiency 08/20/2016  . Breast cancer ([place])   . Diabetes mellitus type 2, controlled, without complications ([place]) 4401  . Dysphagia 09/06/2016  . Effusion of right knee 08/18/2016  . Essential hypertension   . History of stroke with residual deficit 08/20/2016  . Hyperlipidemia   . Hypomagnesemia   . Inflammatory polyarthritis ([place]) 09/19/2016  . Leucocytosis   . Macular degeneration   . Osteoarthritis of right knee   . Physical debility   . Sacral decubitus ulcer, stage II 10/28/2016  . Sepsis ([place]) 10/28/2016  . Stroke [facility]) 2011   left sided weakness baseline  . Urinary incontinence in female   . UTI (urinary tract infection) 09/19/2016  . Vascular dementia   . Vitamin B12 deficiency     Past Surgical History:  Procedure Laterality Date  . ABDOMINAL HYSTERECTOMY    . BREAST LUMPECTOMY    . CATARACT EXTRACTION      Allergies as of 04/10/2017   No Known Allergies     Medication List        Accurate as of 04/10/17 11:59 PM. Always use your most recent med list.          acetaminophen 500 MG tablet Commonly known as:  TYLENOL Take 1,000 mg by mouth daily with breakfast.     acetaminophen 500 MG tablet Commonly known as:  TYLENOL Take 500 mg by mouth every 6 (six) hours as needed.   amLODipine 10 MG tablet Commonly known as:  NORVASC Take 10 mg by mouth daily.   aspirin 325 MG tablet Take 325 mg by mouth at bedtime.   BIOFREEZE 4 % Gel Generic drug:  Menthol (Topical Analgesic) Apply topically. Apply one application topically to both knees 3 times daily as needed   CALCIUM 600-D PO Take 1 tablet by mouth daily.   Cholecalciferol 1000 units tablet Take 1,000 Units by mouth daily.   clopidogrel 75 MG tablet Commonly known as:  PLAVIX Take 75 mg by mouth daily.   feeding supplement (PRO-STAT SUGAR FREE 64) Liqd Take 30 mLs by mouth 2 (two) times daily.   glipiZIDE 2.5 MG 24 hr tablet Commonly known as:  GLUCOTROL XL Take 2.5 mg by mouth daily.   Lidocaine 4 % Ptch Apply topically. Place one patch on the skin daily to right knee   losartan 50 MG tablet Commonly known as:  COZAAR Take 50 mg by mouth daily.   metFORMIN 1000 MG tablet Commonly known as:  GLUCOPHAGE Take 1,000 mg by mouth 2 (two) times daily with a meal.   metoprolol tartrate 50 MG tablet Commonly known as:  LOPRESSOR Take 75 mg by mouth 2 (two) times daily.   multivitamin tablet Take 1 tablet by mouth  daily.   NON FORMULARY Med pass 120 mL by mouth 2 times daily   omeprazole 20 MG capsule Commonly known as:  PRILOSEC Take 20 mg by mouth 2 (two) times daily.   ondansetron 4 MG tablet Commonly known as:  ZOFRAN Take 4 mg by mouth every 6 (six) hours as needed for nausea.   polyvinyl alcohol 1.4 % ophthalmic solution Commonly known as:  LIQUIFILM TEARS 1 drop. One drop to both eyes four times a day   pravastatin 40 MG tablet Commonly known as:  PRAVACHOL Take 40 mg by mouth at bedtime.   traMADol 50 MG tablet Commonly known as:  ULTRAM Take 50 mg by mouth at bedtime.   vitamin B-12 1000 MCG tablet Commonly known as:  CYANOCOBALAMIN Take 1,000 mcg by  mouth daily.       No orders of the defined types were placed in this encounter.   Immunization History  Administered Date(s) Administered  . Influenza-Unspecified 11/06/2015, 11/26/2016  . PPD Test 08/18/2016  . Pneumococcal Conjugate-13 12/14/2016  . Pneumococcal-Unspecified 09/06/2015  . Tdap 12/15/2016    Social History   Tobacco Use  . Smoking status: Never Smoker  . Smokeless tobacco: Never Used  Substance Use Topics  . Alcohol use: No    Review of Systems  DATA OBTAINED: from patient, nurse GENERAL:  no fevers, fatigue, appetite changes SKIN: No itching, rash HEENT: No complaint RESPIRATORY: No cough, wheezing, SOB CARDIAC: No chest pain, palpitations, lower extremity edema  GI: No abdominal pain, No N/V/D or constipation, No heartburn or reflux  GU: No dysuria, frequency or urgency, or incontinence  MUSCULOSKELETAL: No unrelieved bone/joint pain NEUROLOGIC: No headache, dizziness  PSYCHIATRIC: No overt anxiety or sadness  Vitals:   04/10/17 1131  BP: 130/76  Pulse: 77  Resp: 18  Temp: (!) 96.9 F (36.1 C)   Body mass index is 20.52 kg/m. Physical Exam  GENERAL APPEARANCE: Alert, conversant, No acute distress  SKIN: No diaphoresis rash HEENT: Unremarkable RESPIRATORY: Breathing is even, unlabored. Lung sounds are clear   CARDIOVASCULAR: Heart RRR no murmurs, rubs or gallops. No peripheral edema  GASTROINTESTINAL: Abdomen is soft, non-tender, not distended w/ normal bowel sounds.  GENITOURINARY: Bladder non tender, not distended; with Foley  MUSCULOSKELETAL: No abnormal joints or musculature NEUROLOGIC: Cranial nerves 2-12 grossly intact. Moves all extremities PSYCHIATRIC: Mood and affect appropriate to situation, no behavioral issues  Patient Active Problem List   Diagnosis Date Noted  . Osteoporosis 04/13/2017  . Neurogenic bladder as late effect of cerebrovascular accident (CVA) 04/10/2017  . Vitamin D deficiency 03/02/2017  . MRSA  (methicillin resistant Staphylococcus aureus) septicemia ([place]) 01/19/2017  . Escherichia coli urinary tract infection 01/19/2017  . Parotitis, acute 01/19/2017  . Decubitus ulcer of heel 01/19/2017  . Decubitus ulcer of left lower extremity 01/19/2017  . Sepsis ([place]) 10/28/2016  . Sacral decubitus ulcer, stage II 10/28/2016  . Acute urinary retention 10/28/2016  . GERD (gastroesophageal reflux disease) 10/28/2016  . Esophageal stricture 09/19/2016  . Vomiting and diarrhea 09/19/2016  . Bacterial overgrowth syndrome 09/19/2016  . Inflammatory polyarthritis ([place]) 09/19/2016  . Septic arthritis of knee, right ([place]) 09/19/2016  . UTI (urinary tract infection) due to urinary indwelling Foley catheter ([place]) 09/19/2016  . Dysphagia 09/06/2016  . Arthritis 08/20/2016  . Acute encephalopathy 08/20/2016  . B12 deficiency 08/20/2016  . Acute diarrhea 08/20/2016  . History of stroke with residual deficit 08/20/2016  . Hyperlipidemia 08/20/2016  . Vascular dementia 08/20/2016  . Essential hypertension 08/20/2016  .  Yeast infection involving the vagina and surrounding area 08/20/2016  . Effusion of right knee 08/18/2016  . Stroke ([place]) 02/05/2009  . Diabetes mellitus type 2, controlled, without complications ([place]) 23/55/7322    CMP     Component Value Date/Time   NA 137 02/14/2017   K 4.0 02/14/2017   CL 99 (L) 01/06/2017 0758   CO2 21 (L) 01/06/2017 0758   GLUCOSE 193 (H) 01/06/2017 0758   BUN 20 02/14/2017   CREATININE 0.5 02/14/2017   CREATININE 0.61 01/06/2017 0758   CALCIUM 8.7 (L) 01/06/2017 0758   PROT 7.0 01/06/2017 0758   ALBUMIN 2.4 (L) 01/06/2017 0758   AST 28 01/06/2017 0758   ALT 18 01/06/2017 0758   ALKPHOS 65 01/06/2017 0758   BILITOT 0.8 01/06/2017 0758   GFRNONAA >60 01/06/2017 0758   GFRAA >60 01/06/2017 0758   Recent Labs    01/01/17 01/06/17 0758 01/06/17 0914 02/14/17  NA 135* 132*  --  137  K 4.5 4.7 3.4* 4.0  CL  --  99*  --   --   CO2  --  21*  --    --   GLUCOSE  --  193*  --   --   BUN 30* 19  --  20  CREATININE 0.7 0.61  --  0.5  CALCIUM  --  8.7*  --   --    Recent Labs    11/26/16 01/01/17 01/06/17 0758  AST 15 11* 28  ALT 10 8 18   ALKPHOS 76 69 65  BILITOT  --   --  0.8  PROT  --   --  7.0  ALBUMIN  --   --  2.4*   Recent Labs    01/01/17 01/06/17 0710 02/14/17  WBC 16.2 40.4* 13.0  NEUTROABS  --  34.3*  --   HGB 9.5* 9.8* 9.4*  HCT 29* 30.2* 29*  MCV  --  81.2  --   PLT 443* 635* 471*   Recent Labs    11/26/16 01/01/17  CHOL 164 108  LDLCALC 63 37  TRIG 222* 129   No results found for: [facility] Lab Results  Component Value Date   TSH 1.96 01/01/2017   Lab Results  Component Value Date   HGBA1C 6.6 01/01/2017   Lab Results  Component Value Date   CHOL 108 01/01/2017   HDL 46 01/01/2017   LDLCALC 37 01/01/2017   TRIG 129 01/01/2017    Significant Diagnostic Results in last 30 days:  No results found.  Assessment and Plan  Osteoporosis Stable; continue calcium carbonate plus D 600 mg one by mouth daily  Neurogenic bladder as late effect of cerebrovascular accident (CVA) Neurogenic bladder with urinary retention up to 800 mL; continue Foley catheter to straight drain  Vascular dementia Stable, on no meds at; continue supportive care is being seen for routine issues of    Webb Silversmith D. Sheppard Coil, MD

## 2017-04-13 ENCOUNTER — Encounter: Payer: Self-pay | Admitting: Internal Medicine

## 2017-04-13 DIAGNOSIS — M810 Age-related osteoporosis without current pathological fracture: Secondary | ICD-10-CM | POA: Insufficient documentation

## 2017-04-13 NOTE — Assessment & Plan Note (Signed)
Stable, on no meds at; continue supportive care is being seen for routine issues of

## 2017-04-13 NOTE — Assessment & Plan Note (Signed)
Neurogenic bladder with urinary retention up to 800 mL; continue Foley catheter to straight drain

## 2017-04-13 NOTE — Assessment & Plan Note (Signed)
Stable; continue calcium carbonate plus D 600 mg one by mouth daily

## 2017-04-16 ENCOUNTER — Non-Acute Institutional Stay (SKILLED_NURSING_FACILITY): Payer: Medicare Other | Admitting: Internal Medicine

## 2017-04-16 ENCOUNTER — Encounter: Payer: Self-pay | Admitting: Internal Medicine

## 2017-04-16 DIAGNOSIS — R6889 Other general symptoms and signs: Secondary | ICD-10-CM | POA: Diagnosis not present

## 2017-04-16 LAB — CBC
HCT: 30.6
HGB: 9.7
WBC: 14.7
platelet count: 552

## 2017-04-16 LAB — BASIC METABOLIC PANEL
BUN: 41 — AB (ref 4–21)
Calcium: 9.3
Creat: 0.9
Glucose: 196
POTASSIUM: 4.5
SODIUM: 135

## 2017-04-16 NOTE — Progress Notes (Signed)
Location:  [place] Room Number: 205D Place of Service:  SNF (31)  Hennie Duos, MD  Patient Care Team: Hennie Duos, MD as PCP - General (Internal Medicine)  Extended Emergency Contact Information Primary Emergency Contact: Lucia,[name] Address: [address]          [place], [place] [address] [name] of [place] Phone: [phone] Relation: Son Secondary Emergency Contact: [facility] Phone: [phone] Relation: Other    Allergies: Patient has no known allergies.  Chief Complaint  Patient presents with  . Acute Visit    Sick    HPI: Patient is 84 y.o. female who who nursing asked me to see because daughter is concerned that her mother's ill. Patient admits that she is feeling cold chillstoday and Un[place] her chair with her blanket on when she is normally in the hall socializing.Patient denies cough cold and runny nose shortness of breath nausea vomiting diarrhea, or suprapubic tenderness.Daughter is concerned because she looks like this when she gets a UTI.  Past Medical History:  Diagnosis Date  . Acute encephalopathy 08/20/2016  . B12 deficiency 08/20/2016  . Breast cancer ([place])   . Diabetes mellitus type 2, controlled, without complications ([place]) 1025  . Dysphagia 09/06/2016  . Effusion of right knee 08/18/2016  . Essential hypertension   . History of stroke with residual deficit 08/20/2016  . Hyperlipidemia   . Hypomagnesemia   . Inflammatory polyarthritis ([place]) 09/19/2016  . Leucocytosis   . Macular degeneration   . Osteoarthritis of right knee   . Physical debility   . Sacral decubitus ulcer, stage II 10/28/2016  . Sepsis ([place]) 10/28/2016  . Stroke [facility]) 2011   left sided weakness baseline  . Urinary incontinence in female   . UTI (urinary tract infection) 09/19/2016  . Vascular dementia   . Vitamin B12 deficiency     Past Surgical History:  Procedure Laterality Date  . ABDOMINAL HYSTERECTOMY     . BREAST LUMPECTOMY    . CATARACT EXTRACTION      Allergies as of 04/16/2017   No Known Allergies     Medication List        Accurate as of 04/16/17  2:36 PM. Always use your most recent med list.          acetaminophen 500 MG tablet Commonly known as:  TYLENOL Take 1,000 mg by mouth daily with breakfast.   acetaminophen 500 MG tablet Commonly known as:  TYLENOL Take 500 mg by mouth every 6 (six) hours as needed.   amLODipine 10 MG tablet Commonly known as:  NORVASC Take 10 mg by mouth daily.   aspirin 325 MG tablet Take 325 mg by mouth at bedtime.   BIOFREEZE 4 % Gel Generic drug:  Menthol (Topical Analgesic) Apply topically. Apply one application topically to left knee 3 times daily as needed for arthritis pain   CALCIUM 600-D PO Take 1 tablet by mouth daily.   Cholecalciferol 1000 units tablet Take 1,000 Units by mouth daily.   clopidogrel 75 MG tablet Commonly known as:  PLAVIX Take 75 mg by mouth daily.   escitalopram 5 MG tablet Commonly known as:  LEXAPRO Take 5 mg by mouth at bedtime.   feeding supplement (PRO-STAT SUGAR FREE 64) Liqd Take 30 mLs by mouth 2 (two) times daily.   glipiZIDE 2.5 MG 24 hr tablet Commonly known as:  GLUCOTROL XL Take 2.5 mg by mouth daily.   Lidocaine 4 % Ptch  Apply 1 patch topically daily. Apply to right knee   losartan 50 MG tablet Commonly known as:  COZAAR Take 50 mg by mouth daily.   metFORMIN 1000 MG tablet Commonly known as:  GLUCOPHAGE Take 1,000 mg by mouth 2 (two) times daily with a meal.   metoprolol tartrate 50 MG tablet Commonly known as:  LOPRESSOR Take 75 mg by mouth 2 (two) times daily.   multivitamin tablet Take 1 tablet by mouth daily.   NON FORMULARY Med pass 120 mL by mouth 2 times daily wound healing.   omeprazole 20 MG capsule Commonly known as:  PRILOSEC Take 20 mg by mouth 2 (two) times daily before a meal.   ondansetron 4 MG tablet Commonly known as:  ZOFRAN Take 4 mg by  mouth every 6 (six) hours as needed for nausea or vomiting.   polyvinyl alcohol 1.4 % ophthalmic solution Commonly known as:  LIQUIFILM TEARS Place 1 drop into both eyes 4 (four) times daily.   pravastatin 40 MG tablet Commonly known as:  PRAVACHOL Take 40 mg by mouth at bedtime.   traMADol 50 MG tablet Commonly known as:  ULTRAM Take 50 mg by mouth at bedtime.   vitamin B-12 1000 MCG tablet Commonly known as:  CYANOCOBALAMIN Take 1,000 mcg by mouth daily.       No orders of the defined types were placed in this encounter.   Immunization History  Administered Date(s) Administered  . Influenza-Unspecified 11/06/2015, 11/26/2016  . PPD Test 08/18/2016  . Pneumococcal Conjugate-13 12/14/2016  . Pneumococcal-Unspecified 09/06/2015  . Tdap 12/15/2016    Social History   Tobacco Use  . Smoking status: Never Smoker  . Smokeless tobacco: Never Used  Substance Use Topics  . Alcohol use: No    Review of Systems  DATA OBTAINED: from patient, nurse,family member-as per history of present illness GENERAL:  no fevers+ fatigue, appetite changes SKIN: No itching, rash HEENT: No complaint RESPIRATORY: No cough, wheezing, SOB CARDIAC: No chest pain, palpitations, lower extremity edema  GI: No abdominal pain, No N/V/D or constipation, No heartburn or reflux  GU: No dysuria, frequency or urgency, or incontinence  MUSCULOSKELETAL: No unrelieved bone/joint pain NEUROLOGIC: No headache, dizziness  PSYCHIATRIC: No overt anxiety or sadness  Vitals:   04/16/17 1347  BP: 118/72  Pulse: 69  Resp: 18  Temp: 98 F (36.7 C)  SpO2: 98%   Body mass index is 19.68 kg/m. Physical Exam  GENERAL APPEARANCE: Alert, conversant, looks like she doesn't feel well but is not septic SKIN: No diaphoresis rash HEENT: Unremarkable RESPIRATORY: Breathing is even, unlabored. Lung sounds are clear   CARDIOVASCULAR: Heart RRR no murmurs, rubs or gallops. No peripheral edema  GASTROINTESTINAL:  Abdomen is soft, non-tender, not distended w/ normal bowel sounds.  GENITOURINARY: Bladder non tender, not distended  MUSCULOSKELETAL: No abnormal joints or musculature NEUROLOGIC: Cranial nerves 2-12 grossly intact. Moves all extremities PSYCHIATRIC: Mood and affect appropriate to situation, no behavioral issues  Patient Active Problem List   Diagnosis Date Noted  . Osteoporosis 04/13/2017  . Neurogenic bladder as late effect of cerebrovascular accident (CVA) 04/10/2017  . Vitamin D deficiency 03/02/2017  . MRSA (methicillin resistant Staphylococcus aureus) septicemia ([place]) 01/19/2017  . Escherichia coli urinary tract infection 01/19/2017  . Parotitis, acute 01/19/2017  . Decubitus ulcer of heel 01/19/2017  . Decubitus ulcer of left lower extremity 01/19/2017  . Sepsis ([place]) 10/28/2016  . Sacral decubitus ulcer, stage II 10/28/2016  . Acute urinary retention 10/28/2016  .  GERD (gastroesophageal reflux disease) 10/28/2016  . Esophageal stricture 09/19/2016  . Vomiting and diarrhea 09/19/2016  . Bacterial overgrowth syndrome 09/19/2016  . Inflammatory polyarthritis ([place]) 09/19/2016  . Septic arthritis of knee, right ([place]) 09/19/2016  . UTI (urinary tract infection) due to urinary indwelling Foley catheter ([place]) 09/19/2016  . Dysphagia 09/06/2016  . Arthritis 08/20/2016  . Acute encephalopathy 08/20/2016  . B12 deficiency 08/20/2016  . Acute diarrhea 08/20/2016  . History of stroke with residual deficit 08/20/2016  . Hyperlipidemia 08/20/2016  . Vascular dementia 08/20/2016  . Essential hypertension 08/20/2016  . Yeast infection involving the vagina and surrounding area 08/20/2016  . Effusion of right knee 08/18/2016  . Stroke ([place]) 02/05/2009  . Diabetes mellitus type 2, controlled, without complications ([place]) 76/22/6333    CMP     Component Value Date/Time   NA 137 02/14/2017   K 4.0 02/14/2017   CL 99 (L) 01/06/2017 0758   CO2 21 (L) 01/06/2017 0758   GLUCOSE 193 (H)  01/06/2017 0758   BUN 20 02/14/2017   CREATININE 0.5 02/14/2017   CREATININE 0.61 01/06/2017 0758   CALCIUM 8.7 (L) 01/06/2017 0758   PROT 7.0 01/06/2017 0758   ALBUMIN 2.4 (L) 01/06/2017 0758   AST 28 01/06/2017 0758   ALT 18 01/06/2017 0758   ALKPHOS 65 01/06/2017 0758   BILITOT 0.8 01/06/2017 0758   GFRNONAA >60 01/06/2017 0758   GFRAA >60 01/06/2017 0758   Recent Labs    01/06/17 0758 01/06/17 0914 02/07/17 02/14/17  NA 132*  --  137 137  K 4.7 3.4* 4.1 4.0  CL 99*  --   --   --   CO2 21*  --   --   --   GLUCOSE 193*  --   --   --   BUN 19  --  21 20  CREATININE 0.61  --  0.7 0.5  CALCIUM 8.7*  --   --   --    Recent Labs    11/26/16 01/01/17 01/06/17 0758  AST 15 11* 28  ALT 10 8 18   ALKPHOS 76 69 65  BILITOT  --   --  0.8  PROT  --   --  7.0  ALBUMIN  --   --  2.4*   Recent Labs    01/06/17 0710 02/07/17 02/14/17  WBC 40.4* 14.8 13.0  NEUTROABS 34.3*  --   --   HGB 9.8* 10.1* 9.4*  HCT 30.2* 30* 29*  MCV 81.2  --   --   PLT 635* 489* 471*   Recent Labs    11/26/16 01/01/17  CHOL 164 108  LDLCALC 63 37  TRIG 222* 129   No results found for: [facility] Lab Results  Component Value Date   TSH 1.96 01/01/2017   Lab Results  Component Value Date   HGBA1C 6.6 01/01/2017   Lab Results  Component Value Date   CHOL 108 01/01/2017   HDL 46 01/01/2017   LDLCALC 37 01/01/2017   TRIG 129 01/01/2017    Significant Diagnostic Results in last 30 days:  No results found.  Assessment and Plan-   Feeling ill-i do think something is going on with patient; have ordered a urine for C&S, CBC BMP; we'll monitor    Roch Quach D. Sheppard Coil, MD

## 2017-04-17 ENCOUNTER — Encounter: Payer: Self-pay | Admitting: *Deleted

## 2017-04-19 ENCOUNTER — Non-Acute Institutional Stay (SKILLED_NURSING_FACILITY): Payer: Medicare Other | Admitting: Internal Medicine

## 2017-04-19 DIAGNOSIS — N3090 Cystitis, unspecified without hematuria: Secondary | ICD-10-CM | POA: Diagnosis not present

## 2017-04-19 DIAGNOSIS — B9689 Other specified bacterial agents as the cause of diseases classified elsewhere: Secondary | ICD-10-CM | POA: Diagnosis not present

## 2017-04-21 ENCOUNTER — Encounter: Payer: Self-pay | Admitting: Internal Medicine

## 2017-04-23 ENCOUNTER — Encounter: Payer: Self-pay | Admitting: Internal Medicine

## 2017-04-23 NOTE — Progress Notes (Signed)
Location:  [place] Room Number: 205D Place of Service:  SNF (31)  Hennie Duos, MD  Patient Care Team: Hennie Duos, MD as PCP - General (Internal Medicine)  Extended Emergency Contact Information Primary Emergency Contact: Robitaille,[name] Address: [address]          [place], [place] [address] [name] of [place] Phone: [phone] Relation: Son Secondary Emergency Contact: [facility] Phone: [phone] Relation: Other    Allergies: Patient has no known allergies.  Chief Complaint  Patient presents with  . Acute Visit    UTI    HPI: Patient is 84 y.o. female who was seen seveal days ago for illness, who had a urine sent which is now returned positive for Klebsiella. Patient's white count  Had returned yesterday at 14.7. Patient still denies urinary symptoms. No fever reported still.  Past Medical History:  Diagnosis Date  . Acute encephalopathy 08/20/2016  . B12 deficiency 08/20/2016  . Breast cancer ([place])   . Diabetes mellitus type 2, controlled, without complications ([place]) 3532  . Dysphagia 09/06/2016  . Effusion of right knee 08/18/2016  . Essential hypertension   . History of stroke with residual deficit 08/20/2016  . Hyperlipidemia   . Hypomagnesemia   . Inflammatory polyarthritis ([place]) 09/19/2016  . Leucocytosis   . Macular degeneration   . Osteoarthritis of right knee   . Physical debility   . Sacral decubitus ulcer, stage II 10/28/2016  . Sepsis ([place]) 10/28/2016  . Stroke [facility]) 2011   left sided weakness baseline  . Urinary incontinence in female   . UTI (urinary tract infection) 09/19/2016  . Vascular dementia   . Vitamin B12 deficiency     Past Surgical History:  Procedure Laterality Date  . ABDOMINAL HYSTERECTOMY    . BREAST LUMPECTOMY    . CATARACT EXTRACTION      Allergies as of 04/19/2017   No Known Allergies     Medication List        Accurate as of 04/19/17 11:59 PM. Always  use your most recent med list.          acetaminophen 500 MG tablet Commonly known as:  TYLENOL Take 1,000 mg by mouth daily with breakfast. 2 tabs   acetaminophen 500 MG tablet Commonly known as:  TYLENOL Take 500 mg by mouth every 6 (six) hours as needed.   amLODipine 10 MG tablet Commonly known as:  NORVASC Take 10 mg by mouth daily.   aspirin 325 MG tablet Take 325 mg by mouth at bedtime.   BIOFREEZE 4 % Gel Generic drug:  Menthol (Topical Analgesic) Apply one application topically to left knee 3 times daily as needed for arthritis pain   CALCIUM 600-D PO Take 1 tablet by mouth daily.   Cholecalciferol 1000 units tablet Take 1,000 Units by mouth daily.   clopidogrel 75 MG tablet Commonly known as:  PLAVIX Take 75 mg by mouth daily.   escitalopram 5 MG tablet Commonly known as:  LEXAPRO Take 5 mg by mouth at bedtime.   feeding supplement (PRO-STAT SUGAR FREE 64) Liqd Take 30 mLs by mouth 2 (two) times daily.   glipiZIDE 2.5 MG 24 hr tablet Commonly known as:  GLUCOTROL XL Take 2.5 mg by mouth daily.   Lidocaine 4 % Ptch Apply 1 patch topically daily. Apply to right knee   losartan 50 MG tablet Commonly known as:  COZAAR Take 50 mg by mouth daily.   metFORMIN  1000 MG tablet Commonly known as:  GLUCOPHAGE Take 1,000 mg by mouth 2 (two) times daily with a meal.   metoprolol tartrate 50 MG tablet Commonly known as:  LOPRESSOR Take 75 mg by mouth 2 (two) times daily.   multivitamin tablet Take 1 tablet by mouth daily.   NON FORMULARY Med pass 120 mL by mouth 2 times daily wound healing.   omeprazole 20 MG capsule Commonly known as:  PRILOSEC Take 20 mg by mouth 2 (two) times daily before a meal.   ondansetron 4 MG tablet Commonly known as:  ZOFRAN Take 4 mg by mouth every 6 (six) hours as needed for nausea or vomiting.   polyvinyl alcohol 1.4 % ophthalmic solution Commonly known as:  LIQUIFILM TEARS Place 1 drop into both eyes 4 (four) times  daily.   pravastatin 40 MG tablet Commonly known as:  PRAVACHOL Take 40 mg by mouth at bedtime.   traMADol 50 MG tablet Commonly known as:  ULTRAM Take 50 mg by mouth at bedtime.   vitamin B-12 1000 MCG tablet Commonly known as:  CYANOCOBALAMIN Take 1,000 mcg by mouth daily.       No orders of the defined types were placed in this encounter.   Immunization History  Administered Date(s) Administered  . Influenza-Unspecified 11/06/2015, 11/26/2016  . PPD Test 08/18/2016  . Pneumococcal Conjugate-13 12/14/2016  . Pneumococcal-Unspecified 09/06/2015  . Tdap 12/15/2016    Social History   Tobacco Use  . Smoking status: Never Smoker  . Smokeless tobacco: Never Used  Substance Use Topics  . Alcohol use: No    Review of Systems  DATA OBTAINED: from patient, nurse GENERAL:  no fevers, +fatigue, appetite changes SKIN: No itching, rash HEENT: No complaint RESPIRATORY: No cough, wheezing, SOB CARDIAC: No chest pain, palpitations, lower extremity edema  GI: No abdominal pain, No N/V/D or constipation, No heartburn or reflux  GU: No dysuria, frequency or urgency, or incontinence  MUSCULOSKELETAL: No unrelieved bone/joint pain NEUROLOGIC: No headache, dizziness  PSYCHIATRIC: No overt anxiety or sadness  Vitals:   04/19/17 1520  BP: 129/70  Pulse: 82  Resp: 18  Temp: 97.9 F (36.6 C)  SpO2: 98%   Body mass index is 19.68 kg/m. Physical Exam  GENERAL APPEARANCE: Alert, conversant, No acute distress  Looks sickbut not toxic SKIN: No diaphoresis rash HEENT: Unremarkable RESPIRATORY: Breathing is even, unlabored. Lung sounds are clear   CARDIOVASCULAR: Heart RRR no murmurs, rubs or gallops. No peripheral edema  GASTROINTESTINAL: Abdomen is soft, non-tender, not distended w/ normal bowel sounds.  GENITOURINARY: Bladder non tender, not distended  MUSCULOSKELETAL: No abnormal joints or musculature NEUROLOGIC: Cranial nerves 2-12 grossly intact. Moves all  extremities PSYCHIATRIC: Mood and affect appropriate to situation, no behavioral issues  Patient Active Problem List   Diagnosis Date Noted  . Osteoporosis 04/13/2017  . Neurogenic bladder as late effect of cerebrovascular accident (CVA) 04/10/2017  . Vitamin D deficiency 03/02/2017  . MRSA (methicillin resistant Staphylococcus aureus) septicemia ([place]) 01/19/2017  . Escherichia coli urinary tract infection 01/19/2017  . Parotitis, acute 01/19/2017  . Decubitus ulcer of heel 01/19/2017  . Decubitus ulcer of left lower extremity 01/19/2017  . Sepsis ([place]) 10/28/2016  . Sacral decubitus ulcer, stage II 10/28/2016  . Acute urinary retention 10/28/2016  . GERD (gastroesophageal reflux disease) 10/28/2016  . Esophageal stricture 09/19/2016  . Vomiting and diarrhea 09/19/2016  . Bacterial overgrowth syndrome 09/19/2016  . Inflammatory polyarthritis ([place]) 09/19/2016  . Septic arthritis of knee, right ([place]) 09/19/2016  .  UTI (urinary tract infection) due to urinary indwelling Foley catheter ([place]) 09/19/2016  . Dysphagia 09/06/2016  . Arthritis 08/20/2016  . Acute encephalopathy 08/20/2016  . B12 deficiency 08/20/2016  . Acute diarrhea 08/20/2016  . History of stroke with residual deficit 08/20/2016  . Hyperlipidemia 08/20/2016  . Vascular dementia 08/20/2016  . Essential hypertension 08/20/2016  . Yeast infection involving the vagina and surrounding area 08/20/2016  . Effusion of right knee 08/18/2016  . Stroke ([place]) 02/05/2009  . Diabetes mellitus type 2, controlled, without complications ([place]) 04/26/2246    CMP     Component Value Date/Time   NA 135 04/16/2017   K 4.5 04/16/2017   CL 99 (L) 01/06/2017 0758   CO2 21 (L) 01/06/2017 0758   GLUCOSE 193 (H) 01/06/2017 0758   BUN 41 (A) 04/16/2017   CREATININE 0.90 04/16/2017   CALCIUM 9.3 04/16/2017   PROT 7.0 01/06/2017 0758   ALBUMIN 2.4 (L) 01/06/2017 0758   AST 28 01/06/2017 0758   ALT 18 01/06/2017 0758   ALKPHOS 65  01/06/2017 0758   BILITOT 0.8 01/06/2017 0758   GFRNONAA >60 01/06/2017 0758   GFRAA >60 01/06/2017 0758   Recent Labs    01/06/17 0758  02/07/17 02/14/17 04/16/17  NA 132*  --  137 137 135  K 4.7   < > 4.1 4.0 4.5  CL 99*  --   --   --   --   CO2 21*  --   --   --   --   GLUCOSE 193*  --   --   --   --   BUN 19  --  21 20 41*  CREATININE 0.61  --  0.7 0.5 0.90  CALCIUM 8.7*  --   --   --  9.3   < > = values in this interval not displayed.   Recent Labs    11/26/16 01/01/17 01/06/17 0758  AST 15 11* 28  ALT 10 8 18   ALKPHOS 76 69 65  BILITOT  --   --  0.8  PROT  --   --  7.0  ALBUMIN  --   --  2.4*   Recent Labs    01/06/17 0710 02/07/17 02/14/17 04/16/17  WBC 40.4* 14.8 13.0 14.7  NEUTROABS 34.3*  --   --   --   HGB 9.8* 10.1* 9.4* 9.7  HCT 30.2* 30* 29* 30.6  MCV 81.2  --   --   --   PLT 635* 489* 471*  --    Recent Labs    11/26/16 01/01/17  CHOL 164 108  LDLCALC 63 37  TRIG 222* 129   No results found for: [facility] Lab Results  Component Value Date   TSH 1.96 01/01/2017   Lab Results  Component Value Date   HGBA1C 6.6 01/01/2017   Lab Results  Component Value Date   CHOL 108 01/01/2017   HDL 46 01/01/2017   LDLCALC 37 01/01/2017   TRIG 129 01/01/2017    Significant Diagnostic Results in last 30 days:  No results found.  Assessment and Plan  Klebsiella UTI-patient with a white count of 14.7; patient with creatinine clearance of  46; will treat with Keflex 500 mg every 127 days; monitor response    Jaheem Hedgepath D. Sheppard Coil,  MD

## 2017-04-25 ENCOUNTER — Encounter: Payer: Self-pay | Admitting: Internal Medicine

## 2017-04-26 ENCOUNTER — Encounter: Payer: Self-pay | Admitting: *Deleted

## 2017-04-26 LAB — COMPLETE METABOLIC PANEL WITH GFR
ALBUMIN: 2.2
ALK PHOS: 191
ALT: 19
AST: 28
BUN: 14 (ref 4–21)
CALCIUM: 8.5
CREATININE: 0.89
Glucose: 175
Potassium: 4.8
SODIUM: 140
TOTAL PROTEIN: 5.1 g/dL
Total Bilirubin: 1.2

## 2017-05-06 ENCOUNTER — Non-Acute Institutional Stay (SKILLED_NURSING_FACILITY): Payer: Medicare Other | Admitting: Internal Medicine

## 2017-05-06 DIAGNOSIS — L03116 Cellulitis of left lower limb: Secondary | ICD-10-CM | POA: Diagnosis not present

## 2017-05-06 DIAGNOSIS — L03032 Cellulitis of left toe: Secondary | ICD-10-CM

## 2017-05-08 ENCOUNTER — Encounter: Payer: Self-pay | Admitting: Adult Health

## 2017-05-08 ENCOUNTER — Non-Acute Institutional Stay (SKILLED_NURSING_FACILITY): Payer: Medicare Other | Admitting: Adult Health

## 2017-05-08 ENCOUNTER — Other Ambulatory Visit: Payer: Self-pay

## 2017-05-08 DIAGNOSIS — G894 Chronic pain syndrome: Secondary | ICD-10-CM | POA: Diagnosis not present

## 2017-05-08 DIAGNOSIS — G4700 Insomnia, unspecified: Secondary | ICD-10-CM | POA: Insufficient documentation

## 2017-05-08 DIAGNOSIS — F5101 Primary insomnia: Secondary | ICD-10-CM

## 2017-05-08 MED ORDER — TRAMADOL HCL 50 MG PO TABS: ORAL_TABLET | ORAL | 0 refills | Status: AC

## 2017-05-08 NOTE — Progress Notes (Signed)
Location:   [place] Room Number: 23 D Place of Service:  SNF (31)   CODE STATUS: DNR  No Known Allergies  Chief Complaint  Patient presents with  . Acute Visit    Pain Management    HPI:  Staff reports that she is not getting adequate pain relief. She tells me that she hurts all over; including back; shoulders and lower extremities. She states that she is not getting relief. She states that she is not sleeping well at night. Staff reports that she was on ultram 50 mg twice daily but was sleeping too much during the day so this doe was reduced to night time only. Now the problem is that she is continuing to have pain.   Past Medical History:  Diagnosis Date  . Acute encephalopathy 08/20/2016  . B12 deficiency 08/20/2016  . Breast cancer ([place])   . Diabetes mellitus type 2, controlled, without complications ([place]) 1062  . Dysphagia 09/06/2016  . Effusion of right knee 08/18/2016  . Essential hypertension   . History of stroke with residual deficit 08/20/2016  . Hyperlipidemia   . Hypomagnesemia   . Inflammatory polyarthritis ([place]) 09/19/2016  . Leucocytosis   . Macular degeneration   . Osteoarthritis of right knee   . Physical debility   . Sacral decubitus ulcer, stage II 10/28/2016  . Sepsis ([place]) 10/28/2016  . Stroke [facility]) 2011   left sided weakness baseline  . Urinary incontinence in female   . UTI (urinary tract infection) 09/19/2016  . Vascular dementia   . Vitamin B12 deficiency     Past Surgical History:  Procedure Laterality Date  . ABDOMINAL HYSTERECTOMY    . BREAST LUMPECTOMY    . CATARACT EXTRACTION      Social History   Socioeconomic History  . Marital status: Widowed    Spouse name: Not on file  . Number of children: Not on file  . Years of education: Not on file  . Highest education level: Not on file  Occupational History  . Occupation: Housewife  Social Needs  . Financial resource strain: Not on file  . Food  insecurity:    Worry: Not on file    Inability: Not on file  . Transportation needs:    Medical: Not on file    Non-medical: Not on file  Tobacco Use  . Smoking status: Never Smoker  . Smokeless tobacco: Never Used  Substance and Sexual Activity  . Alcohol use: No  . Drug use: No  . Sexual activity: Never  Lifestyle  . Physical activity:    Days per week: Not on file    Minutes per session: Not on file  . Stress: Not on file  Relationships  . Social connections:    Talks on phone: Not on file    Gets together: Not on file    Attends religious service: Not on file    Active member of club or organization: Not on file    Attends meetings of clubs or organizations: Not on file    Relationship status: Not on file  . Intimate partner violence:    Fear of current or ex partner: Not on file    Emotionally abused: Not on file    Physically abused: Not on file    Forced sexual activity: Not on file  Other Topics Concern  . Not on file  Social History Narrative   Admitted to [facility] 08/18/16  Widowed    Never smoked   Alcohol none   DNR   Family History  Problem Relation Age of Onset  . Hypertension Mother   . Macular degeneration Father   . Stroke Father   . Macular degeneration Sister       VITAL SIGNS BP 132/68   Pulse 82   Temp (!) 97 F (36.1 C)   Resp 20   Ht 5\' 2"  (1.575 m)   Wt 107 lb 9.6 oz (48.8 kg)   BMI 19.68 kg/m   Outpatient Encounter Medications as of 05/08/2017  Medication Sig  . acetaminophen (TYLENOL) 500 MG tablet Take 1,000 mg by mouth daily with breakfast. 2 tabs  . acetaminophen (TYLENOL) 500 MG tablet Take 500 mg by mouth every 6 (six) hours as needed.  . Amino Acids-Protein Hydrolys (FEEDING SUPPLEMENT, PRO-STAT SUGAR FREE 64,) LIQD Take 30 mLs by mouth 2 (two) times daily.   Marland Kitchen amLODipine (NORVASC) 10 MG tablet Take 10 mg by mouth daily.   Marland Kitchen aspirin 325 MG tablet Take 325 mg by mouth at bedtime.   . bisacodyl (BISCOLAX)  10 MG suppository Place 10 mg rectally daily as needed for moderate constipation. If no relief by MOM  . Calcium Carb-Cholecalciferol (CALCIUM 600-D PO) Take 1 tablet by mouth daily.   . Cholecalciferol 1000 units tablet Take 1,000 Units by mouth daily.   . clopidogrel (PLAVIX) 75 MG tablet Take 75 mg by mouth daily.   Marland Kitchen doxycycline (DORYX) 100 MG EC tablet Take 100 mg by mouth 2 (two) times daily. X 7 days  . escitalopram (LEXAPRO) 5 MG tablet Take 5 mg by mouth at bedtime.  Marland Kitchen glipiZIDE (GLUCOTROL XL) 2.5 MG 24 hr tablet Take 2.5 mg by mouth daily.   . Lidocaine 4 % PTCH Apply 1 patch topically daily. Apply to right knee  . losartan (COZAAR) 50 MG tablet Take 50 mg by mouth daily.   . Magnesium Hydroxide (MILK OF MAGNESIA PO) Give 30 cc by mouth daily as needed if no BM in 3 days  . Menthol, Topical Analgesic, (BIOFREEZE) 4 % GEL Apply one application topically to left knee 3 times daily as needed for arthritis pain  . metFORMIN (GLUCOPHAGE) 1000 MG tablet Take 1,000 mg by mouth 2 (two) times daily with a meal.  . metoprolol tartrate (LOPRESSOR) 50 MG tablet Take 75 mg by mouth 2 (two) times daily.   . Multiple Vitamin (MULTIVITAMIN) tablet Take 1 tablet by mouth daily.  . NON FORMULARY Med pass 120 mL by mouth 2 times daily wound healing.  Marland Kitchen omeprazole (PRILOSEC) 20 MG capsule Take 20 mg by mouth 2 (two) times daily before a meal.   . ondansetron (ZOFRAN) 4 MG tablet Take 4 mg by mouth every 6 (six) hours as needed for nausea or vomiting.   . polyvinyl alcohol (LIQUIFILM TEARS) 1.4 % ophthalmic solution Place 1 drop into both eyes 4 (four) times daily.   . pravastatin (PRAVACHOL) 40 MG tablet Take 40 mg by mouth at bedtime.   . Sodium Phosphates (FLEET ENEMA RE) Give 1 dose/24 hrs as needed if not relieved by Byscodyl  . traMADol (ULTRAM) 50 MG tablet Take 50 mg by mouth at bedtime.   . vitamin B-12 (CYANOCOBALAMIN) 1000 MCG tablet Take 1,000 mcg by mouth daily.   No facility-administered  encounter medications on file as of 05/08/2017.      SIGNIFICANT DIAGNOSTIC EXAMS   LABS REVIEWED: TODAY:   04-16-17: wbc 14.7; hgb 9.7; hct  30.6;plt 552; glucose 196; bun 41; creat 0.90; k+ 4.5; na++ 135; ca 9.3   Review of Systems  Constitutional: Negative for malaise/fatigue.  Respiratory: Negative for cough and shortness of breath.   Cardiovascular: Negative for chest pain, palpitations and leg swelling.  Gastrointestinal: Negative for abdominal pain, constipation and heartburn.  Musculoskeletal: Positive for back pain, joint pain and myalgias.  Skin: Negative.   Neurological: Negative for dizziness.  Psychiatric/Behavioral: The patient is not nervous/anxious.     Physical Exam  Constitutional: No distress.  Frail   Neck: No thyromegaly present.  Cardiovascular: Normal rate, regular rhythm, normal heart sounds and intact distal pulses.  Pulmonary/Chest: Effort normal and breath sounds normal. No respiratory distress.  Abdominal: Soft. Bowel sounds are normal. She exhibits no distension. There is no tenderness.  Genitourinary:  Genitourinary Comments: Foley with clear yellow urine   Musculoskeletal: She exhibits no edema.  Is able to move all extremities   Lymphadenopathy:    She has no cervical adenopathy.  Neurological: She is alert.  Skin: Skin is warm and dry. She is not diaphoretic.  Psychiatric: She has a normal mood and affect.      ASSESSMENT/ PLAN:  TODAY:   1. Chronic pain syndrome: is without change: will continue tylenol 1 gm daily and 500 mg every 6 hours as needed; lidoderm 4% patch to right knee and biofreeze to left knee three times daily as needed. Will change ultram to 25 mg in the AM and 50 mg in the PM and will monitor her status.   2. Insomnia: is worse: will continue melatonin 5 mg nightly and will monitor   MD is aware of resident's narcotic use and is in agreement with current plan of care. We will attempt to wean resident as apropriate    Ok Edwards NP [facility]  Contact [phone] Monday through Friday 8am- 5pm  After hours call [phone]

## 2017-05-08 NOTE — Telephone Encounter (Signed)
Sent to [employer]

## 2017-06-15 ENCOUNTER — Encounter: Payer: Self-pay | Admitting: Internal Medicine

## 2017-06-15 NOTE — Progress Notes (Signed)
Location:  [place] of Service:  SNF (31)  Lauren Duos, MD  Patient Care Team: Lauren Duos, MD as PCP - General (Internal Medicine)  Extended Emergency Contact Information Primary Emergency Contact: [name],[name] Address: [address]          [place], [place] [address] Lauren Dennis of [place] Phone: [phone] Relation: Son Secondary Emergency Contact: [facility] Phone: [phone] Relation: Other    Allergies: Patient has no known allergies.  Chief Complaint  Patient presents with  . Acute Visit    HPI: Patient is 84 y.o. female who nursing asked me to see acutely for possible cellulitis of her toe.  On exam patient's left second toe is red and tender to palpation and the  distal top of the foot is also red and tender.  No other toes are red or tender.  Patient has not had any fever, chills, nausea, vomiting, or any other systemic symptoms.  Past Medical History:  Diagnosis Date  . Acute encephalopathy 08/20/2016  . B12 deficiency 08/20/2016  . Breast cancer ([place])   . Diabetes mellitus type 2, controlled, without complications ([place]) 6301  . Dysphagia 09/06/2016  . Effusion of right knee 08/18/2016  . Essential hypertension   . History of stroke with residual deficit 08/20/2016  . Hyperlipidemia   . Hypomagnesemia   . Inflammatory polyarthritis ([place]) 09/19/2016  . Leucocytosis   . Macular degeneration   . Osteoarthritis of right knee   . Physical debility   . Sacral decubitus ulcer, stage II 10/28/2016  . Sepsis ([place]) 10/28/2016  . Stroke [facility]) 2011   left sided weakness baseline  . Urinary incontinence in female   . UTI (urinary tract infection) 09/19/2016  . Vascular dementia   . Vitamin B12 deficiency     Past Surgical History:  Procedure Laterality Date  . ABDOMINAL HYSTERECTOMY    . BREAST LUMPECTOMY    . CATARACT EXTRACTION      Allergies as of 05/06/2017   No Known Allergies     Medication  List        Accurate as of 05/06/17 11:59 PM. Always use your most recent med list.          acetaminophen 500 MG tablet Commonly known as:  TYLENOL Take 1,000 mg by mouth daily with breakfast. 2 tabs   acetaminophen 500 MG tablet Commonly known as:  TYLENOL Take 500 mg by mouth every 6 (six) hours as needed.   amLODipine 10 MG tablet Commonly known as:  NORVASC Take 10 mg by mouth daily.   aspirin 325 MG tablet Take 325 mg by mouth at bedtime.   BIOFREEZE 4 % Gel Generic drug:  Menthol (Topical Analgesic) Apply one application topically to left knee 3 times daily as needed for arthritis pain   CALCIUM 600-D PO Take 1 tablet by mouth daily.   Cholecalciferol 1000 units tablet Take 1,000 Units by mouth daily.   clopidogrel 75 MG tablet Commonly known as:  PLAVIX Take 75 mg by mouth daily.   escitalopram 5 MG tablet Commonly known as:  LEXAPRO Take 5 mg by mouth at bedtime.   feeding supplement (PRO-STAT SUGAR FREE 64) Liqd Take 30 mLs by mouth 2 (two) times daily.   glipiZIDE 2.5 MG 24 hr tablet Commonly known as:  GLUCOTROL XL Take 2.5 mg by mouth daily.   Lidocaine 4 % Ptch Apply 1 patch topically daily. Apply to right knee   losartan  50 MG tablet Commonly known as:  COZAAR Take 50 mg by mouth daily.   metFORMIN 1000 MG tablet Commonly known as:  GLUCOPHAGE Take 1,000 mg by mouth 2 (two) times daily with a meal.   metoprolol tartrate 50 MG tablet Commonly known as:  LOPRESSOR Take 75 mg by mouth 2 (two) times daily.   multivitamin tablet Take 1 tablet by mouth daily.   NON FORMULARY Med pass 120 mL by mouth 2 times daily wound healing.   omeprazole 20 MG capsule Commonly known as:  PRILOSEC Take 20 mg by mouth 2 (two) times daily before a meal.   ondansetron 4 MG tablet Commonly known as:  ZOFRAN Take 4 mg by mouth every 6 (six) hours as needed for nausea or vomiting.   polyvinyl alcohol 1.4 % ophthalmic solution Commonly known as:   LIQUIFILM TEARS Place 1 drop into both eyes 4 (four) times daily.   pravastatin 40 MG tablet Commonly known as:  PRAVACHOL Take 40 mg by mouth at bedtime.   traMADol 50 MG tablet Commonly known as:  ULTRAM Take 50 mg by mouth at bedtime.   vitamin B-12 1000 MCG tablet Commonly known as:  CYANOCOBALAMIN Take 1,000 mcg by mouth daily.       No orders of the defined types were placed in this encounter.   Immunization History  Administered Date(s) Administered  . Influenza-Unspecified 11/06/2015, 11/26/2016  . PPD Test 08/18/2016  . Pneumococcal Conjugate-13 12/14/2016  . Pneumococcal-Unspecified 09/06/2015  . Tdap 12/15/2016    Social History   Tobacco Use  . Smoking status: Never Smoker  . Smokeless tobacco: Never Used  Substance Use Topics  . Alcohol use: No    Review of Systems  DATA OBTAINED: from nurse and patient GENERAL:  no fevers, fatigue, appetite changes SKIN: Toe is red HEENT: No complaint RESPIRATORY: No cough, wheezing, SOB CARDIAC: No chest pain, palpitations, lower extremity edema  GI: No abdominal pain, No N/V/D or constipation, No heartburn or reflux  GU: No dysuria, frequency or urgency, or incontinence  MUSCULOSKELETAL: No unrelieved bone/joint pain NEUROLOGIC: No headache, dizziness  PSYCHIATRIC: No overt anxiety or sadness  Vitals:   06/15/17 2111  BP: 132/68  Pulse: 82  Resp: 20  Temp: (!) 97 F (36.1 C)   There is no height or weight on file to calculate BMI. Physical Exam  GENERAL APPEARANCE: Alert, conversant, No acute distress  SKIN: Left second toe is red with slight swelling and tender to palpation as well as the distal top of foot; no other toe is affected HEENT: Unremarkable RESPIRATORY: Breathing is even, unlabored. Lung sounds are clear   CARDIOVASCULAR: Heart RRR no murmurs, rubs or gallops. No peripheral edema  GASTROINTESTINAL: Abdomen is soft, non-tender, not distended w/ normal bowel sounds.  GENITOURINARY:  Bladder non tender, not distended  MUSCULOSKELETAL: No abnormal joints or musculature NEUROLOGIC: Cranial nerves 2-12 grossly intact. Moves all extremities PSYCHIATRIC: Mood and affect appropriate to situation, no behavioral issues  Patient Active Problem List   Diagnosis Date Noted  . Chronic pain syndrome 05/08/2017  . Insomnia 05/08/2017  . Osteoporosis 04/13/2017  . Neurogenic bladder as late effect of cerebrovascular accident (CVA) 04/10/2017  . Vitamin D deficiency 03/02/2017  . MRSA (methicillin resistant Staphylococcus aureus) septicemia ([place]) 01/19/2017  . Escherichia coli urinary tract infection 01/19/2017  . Parotitis, acute 01/19/2017  . Decubitus ulcer of heel 01/19/2017  . Decubitus ulcer of left lower extremity 01/19/2017  . Sepsis ([place]) 10/28/2016  . Sacral decubitus  ulcer, stage II 10/28/2016  . Acute urinary retention 10/28/2016  . GERD (gastroesophageal reflux disease) 10/28/2016  . Esophageal stricture 09/19/2016  . Vomiting and diarrhea 09/19/2016  . Bacterial overgrowth syndrome 09/19/2016  . Inflammatory polyarthritis ([place]) 09/19/2016  . Septic arthritis of knee, right ([place]) 09/19/2016  . UTI (urinary tract infection) due to urinary indwelling Foley catheter ([place]) 09/19/2016  . Dysphagia 09/06/2016  . Arthritis 08/20/2016  . Acute encephalopathy 08/20/2016  . B12 deficiency 08/20/2016  . Acute diarrhea 08/20/2016  . History of stroke with residual deficit 08/20/2016  . Hyperlipidemia 08/20/2016  . Vascular dementia 08/20/2016  . Essential hypertension 08/20/2016  . Yeast infection involving the vagina and surrounding area 08/20/2016  . Effusion of right knee 08/18/2016  . Stroke ([place]) 02/05/2009  . Diabetes mellitus type 2, controlled, without complications ([place]) 94/85/4627    CMP     Component Value Date/Time   NA 140 04/26/2017   K 4.8 04/26/2017   CL 99 (L) 01/06/2017 0758   CO2 21 (L) 01/06/2017 0758   GLUCOSE 193 (H) 01/06/2017 0758   BUN  14 04/26/2017   CREATININE 0.89 04/26/2017   CALCIUM 8.5 04/26/2017   PROT 5.1 04/26/2017   ALBUMIN 2.2 04/26/2017   AST 28 04/26/2017   ALT 19 04/26/2017   ALKPHOS 191 04/26/2017   BILITOT 1.2 04/26/2017   GFRNONAA >60 01/06/2017 0758   GFRAA >60 01/06/2017 0758   Recent Labs    01/06/17 0758  02/14/17 04/16/17 04/26/17  NA 132*   < > 137 135 140  K 4.7   < > 4.0 4.5 4.8  CL 99*  --   --   --   --   CO2 21*  --   --   --   --   GLUCOSE 193*  --   --   --   --   BUN 19   < > 20 41* 14  CREATININE 0.61   < > 0.5 0.90 0.89  CALCIUM 8.7*  --   --  9.3 8.5   < > = values in this interval not displayed.   Recent Labs    01/01/17 01/06/17 0758 04/26/17  AST 11* 28 28  ALT 8 18 19   ALKPHOS 69 65 191  BILITOT  --  0.8 1.2  PROT  --  7.0 5.1  ALBUMIN  --  2.4* 2.2   Recent Labs    01/06/17 0710 02/07/17 02/14/17 04/16/17  WBC 40.4* 14.8 13.0 14.7  NEUTROABS 34.3*  --   --   --   HGB 9.8* 10.1* 9.4* 9.7  HCT 30.2* 30* 29* 30.6  MCV 81.2  --   --   --   PLT 635* 489* 471*  --    Recent Labs    11/26/16 01/01/17  CHOL 164 108  LDLCALC 63 37  TRIG 222* 129   No results found for: [facility] Lab Results  Component Value Date   TSH 1.96 01/01/2017   Lab Results  Component Value Date   HGBA1C 6.6 01/01/2017   Lab Results  Component Value Date   CHOL 108 01/01/2017   HDL 46 01/01/2017   LDLCALC 37 01/01/2017   TRIG 129 01/01/2017    Significant Diagnostic Results in last 30 days:  No results found.  Assessment and Plan  Left second toe and foot cellulitis- patient has no known drug allergies will start doxycycline 100 mg twice daily x7 days; will extend length of antibiotics based on response.  Inocencio Homes, MD

## 2017-07-06 DEATH — deceased

## 2019-11-21 IMAGING — CT CT NECK W/ CM
4 of 6 series · 13 of 35 positions shown, 15 images · IV contrast (iopamidol)
Comparison: None.

CLINICAL DATA: [HOSPITAL] patient with facial swelling. Elevated
white count.

EXAM:
CT NECK WITH CONTRAST
TECHNIQUE: Multidetector CT imaging of the neck was performed using the
standard protocol following the bolus administration of intravenous
contrast.
CONTRAST:  75mL I4FMJ7-DSS IOPAMIDOL (I4FMJ7-DSS) INJECTION 61%

[Series 8: sag neck · sagittal · 0.59mm/px · 5 of 119 slices shown, 6 images]
[im 40/119  bone]
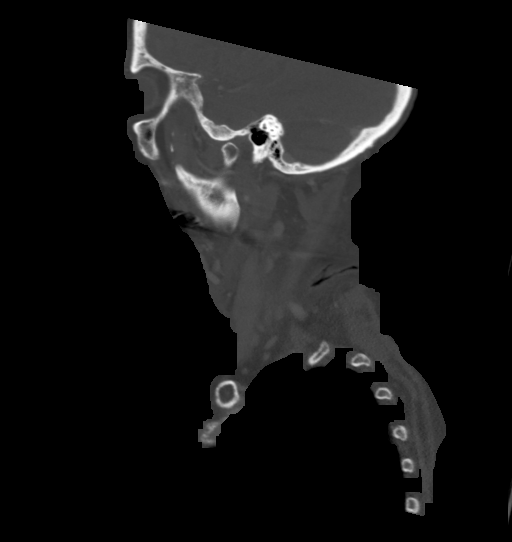
[im 50/119  bone]
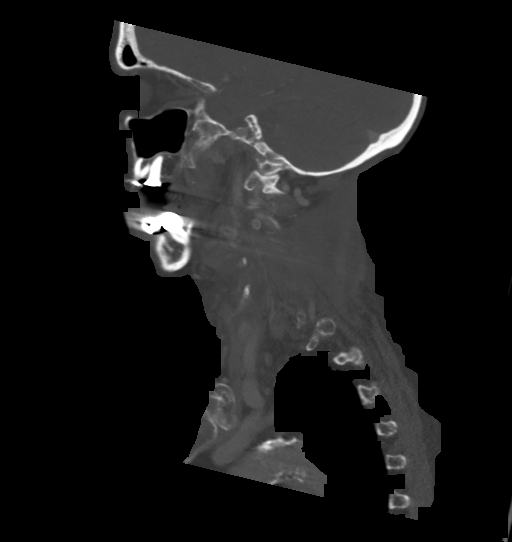
[im 60/119  soft-tissue]
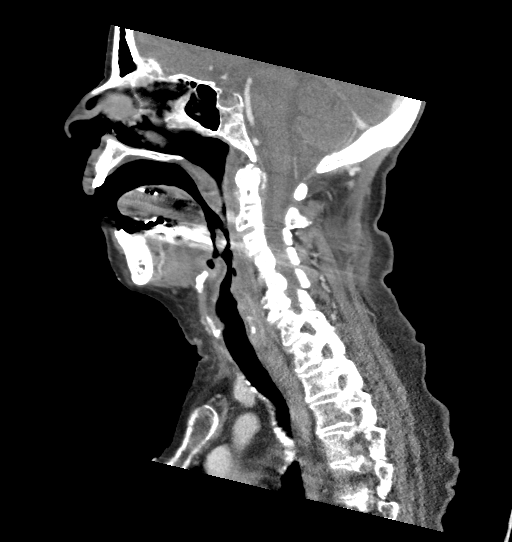
[im 60/119  bone]
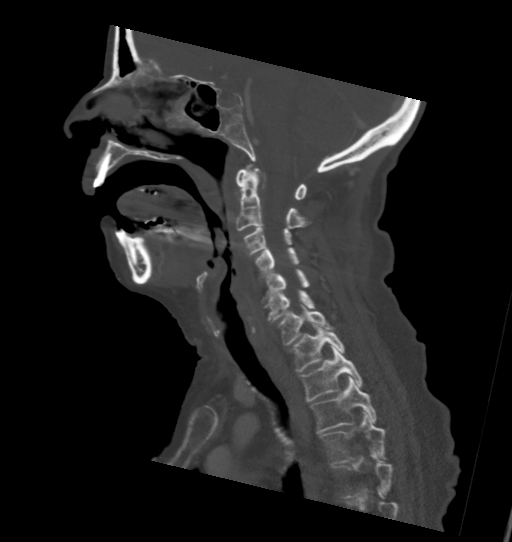
[im 69/119  bone]
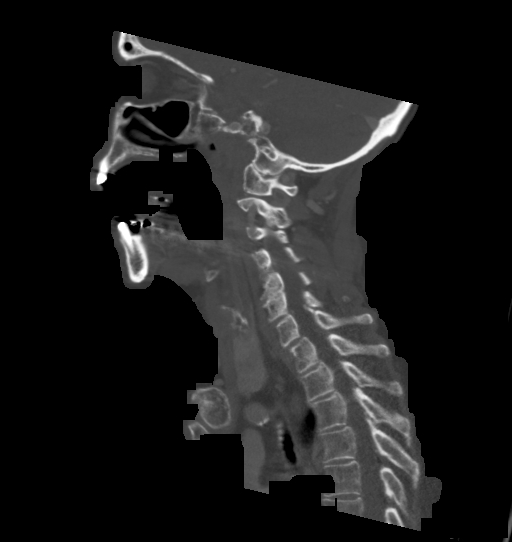
[im 79/119  bone]
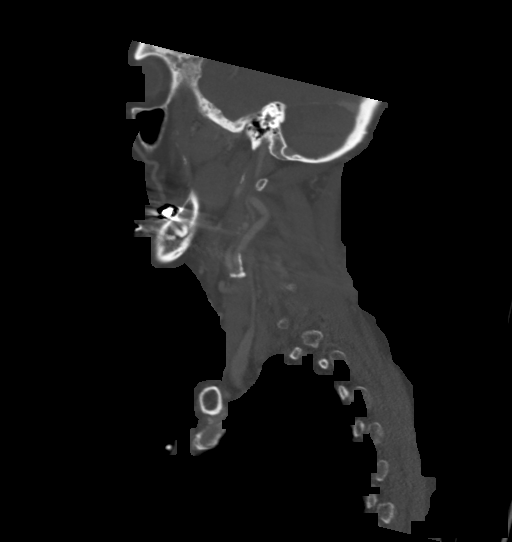

[Series 9: cor neck · coronal · 0.46mm/px · 3 of 88 slices shown]
[im 18/88  bone]
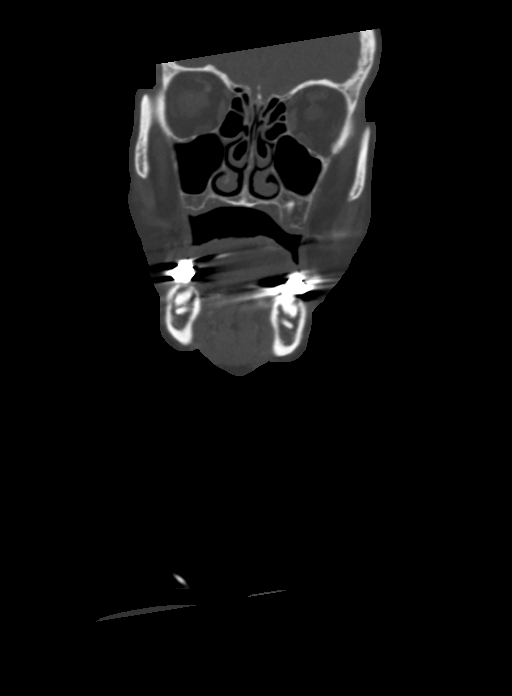
[im 35/88  bone]
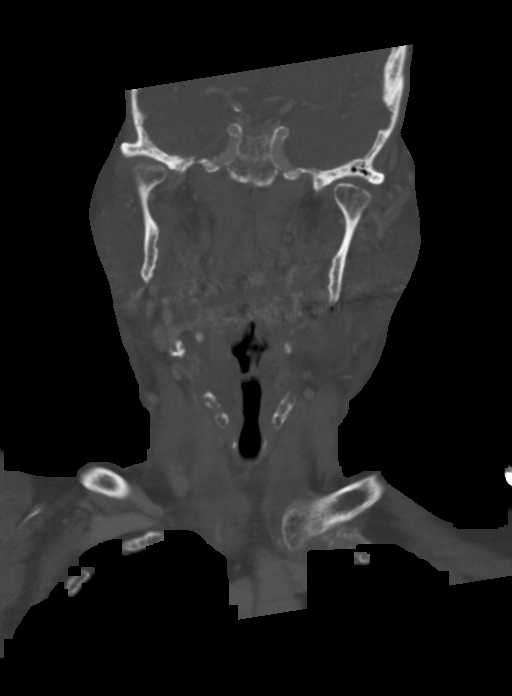
[im 53/88  bone]
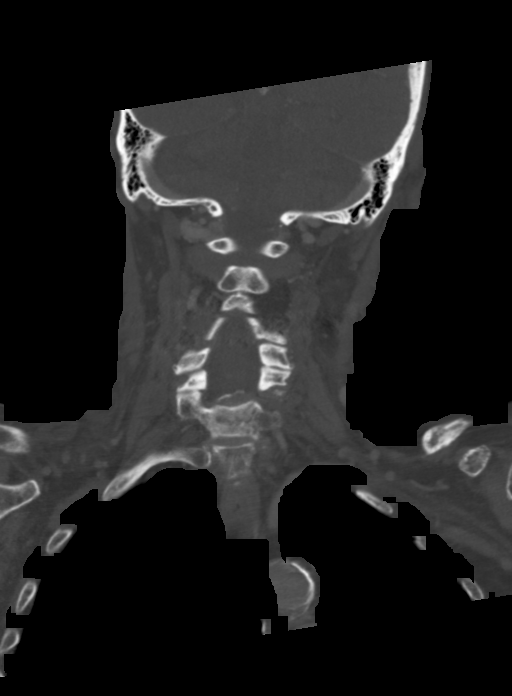

[Series 10: orthogonal ax · axial · 0.56mm/px · z∈[-224,-89]mm · 3 of 141 slices shown, 4 images]
[im 36/141  soft-tissue]
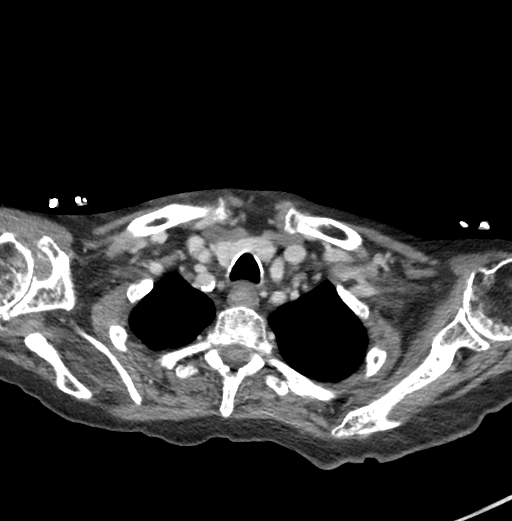
[im 36/141  bone]
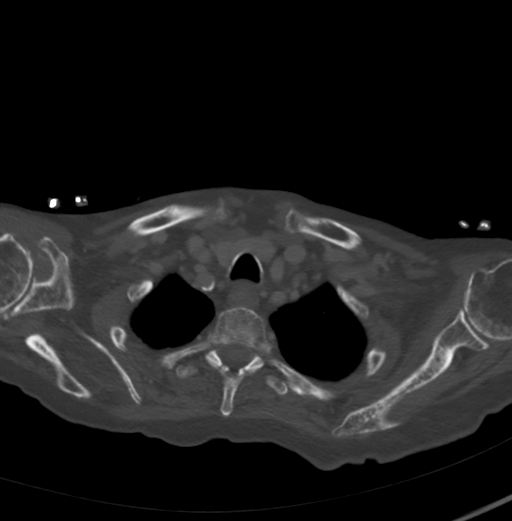
[im 71/141  bone]
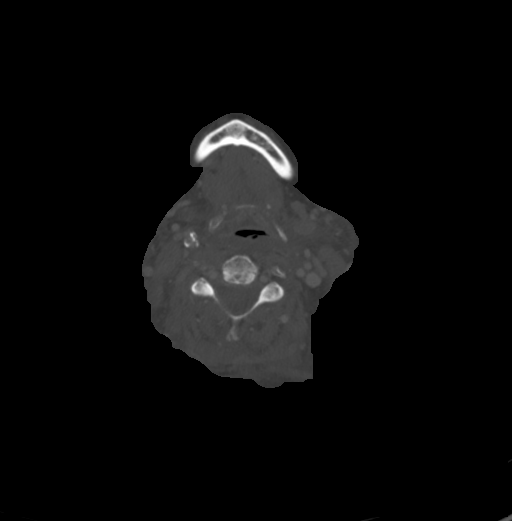
[im 106/141  bone]
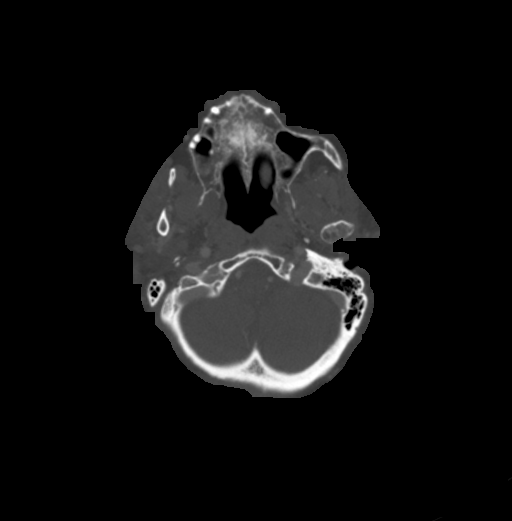

[Series 11: axial neck (person_name) · axial · 0.62mm/px · z∈[-187,-107]mm · 2 of 122 slices shown]
[im 41/122  bone]
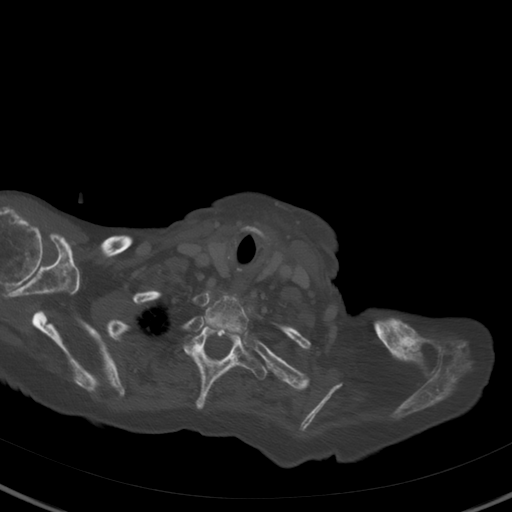
[im 81/122  bone]
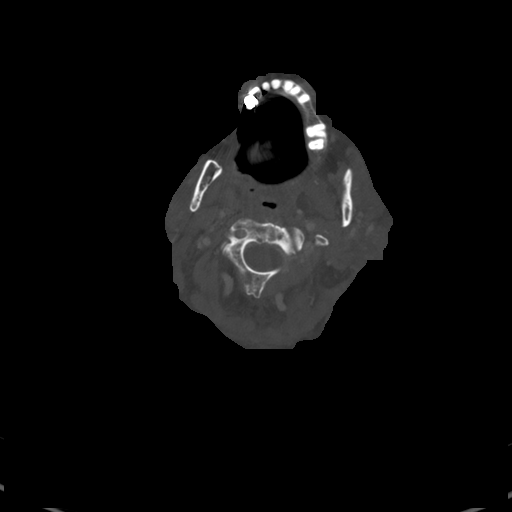

[13 of 35 positions shown; findings below may reference images not displayed]

FINDINGS: Pharynx and larynx: There is significant obscuration of much of the
oropharynx and hypopharynx due to artifact from dental amalgam. No
mucosal lesion or pharyngeal mass. There is an effusion in the
retropharyngeal space, but no enhancement to suggest retropharyngeal
abscess. Mildly edematous epiglottis. Normal larynx.

Salivary glands: There is marked enlargement, edema, and enhancement
of both parotid glands, greater on the LEFT, with inflammatory
change and fluid tracking downward into the neck. Given the elevated
white count, infectious bacterial or viral parotitis is suggested.
No evidence for parotid abscess. No definite involvement of the
submandibular glands.

Thyroid: Normal.

Lymph nodes: There may be reactive level 2 lymph nodes, but no
pathologic adenopathy is seen.

Vascular: Carotid bifurcation atherosclerosis. No definite vascular
occlusion or septic thrombophlebitis. Calcification of the cavernous
internal carotid arteries consistent with cerebrovascular
atherosclerotic disease.

Limited intracranial: Mild atrophy, not unexpected for age.

Visualized orbits: BILATERAL cataract extraction.

Mastoids and visualized paranasal sinuses: No mastoid fluid.

Skeleton: Advanced spondylosis.  No worrisome osseous features.

Upper chest: No pneumothorax or consolidation.

Other: None.
IMPRESSION: Marked enlargement, edema, and enhancement of both parotid glands,
greater on the LEFT, consistent with infectious parotitis this could
be bacterial or viral. No evidence for parotid abscess. No
pathologic lymphadenopathy.

Significant artifact from patient's dental work, obscures much of
the pharynx. There is a small retropharyngeal effusion, but no
evidence for tonsillar or pharyngeal mass.
